# Patient Record
Sex: Male | Born: 1945
Health system: Southern US, Community
[De-identification: ages and names within clinical notes are randomized; demographics above are authoritative.]

## PROBLEM LIST (undated history)

## (undated) DIAGNOSIS — G20 Parkinson's disease: Secondary | ICD-10-CM

## (undated) DIAGNOSIS — F329 Major depressive disorder, single episode, unspecified: Secondary | ICD-10-CM

## (undated) DIAGNOSIS — F32A Depression, unspecified: Secondary | ICD-10-CM

## (undated) DIAGNOSIS — E119 Type 2 diabetes mellitus without complications: Secondary | ICD-10-CM

## (undated) DIAGNOSIS — E7800 Pure hypercholesterolemia, unspecified: Secondary | ICD-10-CM

## (undated) HISTORY — DX: Major depressive disorder, single episode, unspecified: F32.9

## (undated) HISTORY — DX: Depression, unspecified: F32.A

## (undated) HISTORY — DX: Parkinson's disease: G20

## (undated) HISTORY — PX: LEG SURGERY: SHX1003

---

## 2003-03-06 ENCOUNTER — Emergency Department (HOSPITAL_COMMUNITY): Admission: AD | Admit: 2003-03-06 | Discharge: 2003-03-06 | Payer: Self-pay | Admitting: Family Medicine

## 2008-01-25 ENCOUNTER — Emergency Department (HOSPITAL_COMMUNITY): Admission: EM | Admit: 2008-01-25 | Discharge: 2008-01-25 | Payer: Self-pay | Admitting: Emergency Medicine

## 2009-08-12 ENCOUNTER — Emergency Department (HOSPITAL_COMMUNITY): Admission: EM | Admit: 2009-08-12 | Discharge: 2009-08-12 | Payer: Self-pay | Admitting: Emergency Medicine

## 2009-09-04 ENCOUNTER — Emergency Department (HOSPITAL_COMMUNITY): Admission: EM | Admit: 2009-09-04 | Discharge: 2009-09-04 | Payer: Self-pay | Admitting: Emergency Medicine

## 2010-07-12 LAB — COMPREHENSIVE METABOLIC PANEL
ALT: 17 U/L (ref 0–53)
AST: 20 U/L (ref 0–37)
Albumin: 3.6 g/dL (ref 3.5–5.2)
Alkaline Phosphatase: 66 U/L (ref 39–117)
BUN: 14 mg/dL (ref 6–23)
CO2: 26 mEq/L (ref 19–32)
CO2: 25 mEq/L (ref 19–32)
Calcium: 9.2 mg/dL (ref 8.4–10.5)
Calcium: 9 mg/dL (ref 8.4–10.5)
Chloride: 105 mEq/L (ref 96–112)
Chloride: 104 mEq/L (ref 96–112)
Creatinine, Ser: 0.6 mg/dL (ref 0.4–1.5)
Creatinine, Ser: 0.57 mg/dL (ref 0.4–1.5)
GFR calc Af Amer: >60 mL/min (ref >60)
GFR calc non Af Amer: >60 mL/min (ref >60)
GFR calc non Af Amer: >60 mL/min (ref >60)
Glucose, Bld: 114 mg/dL — ABNORMAL HIGH (ref 70–99)
Glucose, Bld: 86 mg/dL (ref 70–99)
Potassium: 4.1 mEq/L (ref 3.5–5.1)
Sodium: 137 mEq/L (ref 135–145)
Total Bilirubin: 0.7 mg/dL (ref 0.3–1.2)
Total Bilirubin: 0.7 mg/dL (ref 0.3–1.2)
Total Protein: 7.4 g/dL (ref 6.0–8.3)

## 2010-07-12 LAB — CBC
HCT: 41.3 % (ref 39.0–52.0)
HCT: 40.6 % (ref 39.0–52.0)
Hemoglobin: 13.9 g/dL (ref 13.0–17.0)
Hemoglobin: 14.1 g/dL (ref 13.0–17.0)
MCHC: 33.7 g/dL (ref 30.0–36.0)
MCHC: 34.9 g/dL (ref 30.0–36.0)
MCV: 93.4 fL (ref 78.0–100.0)
MCV: 93.1 fL (ref 78.0–100.0)
Platelets: 268 10*3/uL (ref 150–400)
RBC: 4.43 MIL/uL (ref 4.22–5.81)
RBC: 4.36 MIL/uL (ref 4.22–5.81)
RDW: 14.2 % (ref 11.5–15.5)
WBC: 9.6 10*3/uL (ref 4.0–10.5)
WBC: 9.6 10*3/uL (ref 4.0–10.5)

## 2010-07-12 LAB — URINALYSIS, ROUTINE W REFLEX MICROSCOPIC
Bilirubin Urine: NEGATIVE
Bilirubin Urine: NEGATIVE
Glucose, UA: NEGATIVE mg/dL
Glucose, UA: NEGATIVE mg/dL
Hgb urine dipstick: NEGATIVE
Hgb urine dipstick: NEGATIVE
Ketones, ur: NEGATIVE mg/dL
Ketones, ur: NEGATIVE mg/dL
Nitrite: NEGATIVE
Nitrite: NEGATIVE
Protein, ur: NEGATIVE mg/dL
Protein, ur: NEGATIVE mg/dL
Specific Gravity, Urine: 1.016 (ref 1.005–1.030)
Specific Gravity, Urine: 1.014 (ref 1.005–1.030)
Urobilinogen, UA: 0.2 mg/dL (ref 0.0–1.0)
Urobilinogen, UA: 1 mg/dL (ref 0.0–1.0)
pH: 5.5 (ref 5.0–8.0)
pH: 7 (ref 5.0–8.0)

## 2010-07-12 LAB — DIFFERENTIAL
Basophils Absolute: 0 10*3/uL (ref 0.0–0.1)
Basophils Absolute: 0.1 10*3/uL (ref 0.0–0.1)
Basophils Relative: 0 % (ref 0–1)
Eosinophils Absolute: 0.2 10*3/uL (ref 0.0–0.7)
Eosinophils Absolute: 0.1 10*3/uL (ref 0.0–0.7)
Eosinophils Relative: 2 % (ref 0–5)
Eosinophils Relative: 1 % (ref 0–5)
Lymphocytes Relative: 25 % (ref 12–46)
Lymphocytes Relative: 25 % (ref 12–46)
Lymphs Abs: 2.4 10*3/uL (ref 0.7–4.0)
Lymphs Abs: 2.4 10*3/uL (ref 0.7–4.0)
Monocytes Absolute: 0.9 10*3/uL (ref 0.1–1.0)
Monocytes Relative: 9 % (ref 3–12)
Neutro Abs: 6.1 10*3/uL (ref 1.7–7.7)
Neutrophils Relative %: 64 % (ref 43–77)
Neutrophils Relative %: 65 % (ref 43–77)

## 2010-07-12 LAB — LIPASE, BLOOD: Lipase: 32 U/L (ref 11–59)

## 2010-07-12 LAB — URINE CULTURE

## 2010-07-12 LAB — PROTIME-INR
INR: 1.06 (ref 0.00–1.49)
Prothrombin Time: 13.7 seconds (ref 11.6–15.2)

## 2010-07-12 LAB — APTT: aPTT: 25 seconds (ref 24–37)

## 2010-07-12 LAB — GLUCOSE, CAPILLARY: Glucose-Capillary: 124 mg/dL — ABNORMAL HIGH (ref 70–99)

## 2010-07-12 LAB — LACTIC ACID, PLASMA: Lactic Acid, Venous: 1.2 mmol/L (ref 0.5–2.2)

## 2011-01-24 LAB — URINALYSIS, ROUTINE W REFLEX MICROSCOPIC
Glucose, UA: NEGATIVE
Hgb urine dipstick: NEGATIVE
Protein, ur: NEGATIVE
pH: 7.5

## 2011-01-24 LAB — COMPREHENSIVE METABOLIC PANEL
ALT: 21
AST: 17
Albumin: 4
Alkaline Phosphatase: 75
Calcium: 9.2
GFR calc Af Amer: >60
Glucose, Bld: 119 — ABNORMAL HIGH
Potassium: 3.9
Sodium: 138
Total Protein: 7.7

## 2011-01-24 LAB — URINE CULTURE
Colony Count: NO GROWTH
Culture: NO GROWTH

## 2011-01-24 LAB — POCT CARDIAC MARKERS
CKMB, poc: <1 — ABNORMAL LOW
Myoglobin, poc: 60.9
Troponin i, poc: <0.05

## 2011-01-24 LAB — D-DIMER, QUANTITATIVE: D-Dimer, Quant: 1.03 — ABNORMAL HIGH

## 2011-01-24 LAB — DIFFERENTIAL
Eosinophils Absolute: 2.7 — ABNORMAL HIGH
Lymphs Abs: 3.3
Monocytes Absolute: 1.3 — ABNORMAL HIGH
Monocytes Relative: 9
Neutrophils Relative %: 48

## 2011-01-24 LAB — CBC
MCHC: 33.1
Platelets: 343
RDW: 13.6

## 2011-01-24 LAB — GLUCOSE, CAPILLARY: Glucose-Capillary: 124 — ABNORMAL HIGH

## 2013-08-25 ENCOUNTER — Ambulatory Visit: Payer: Self-pay | Attending: Internal Medicine

## 2013-09-24 ENCOUNTER — Telehealth: Payer: Self-pay

## 2013-09-24 NOTE — Telephone Encounter (Signed)
Pt would like to set up Appointment to [facility].Pt will need an Interpeter

## 2013-10-08 ENCOUNTER — Telehealth: Payer: Self-pay | Admitting: Internal Medicine

## 2013-10-08 NOTE — Telephone Encounter (Signed)
Called patient to establish care. Patient unable to wait until mid July . Has scheduled elsewhere.

## 2013-10-21 ENCOUNTER — Encounter: Payer: Self-pay | Admitting: Internal Medicine

## 2013-10-21 ENCOUNTER — Ambulatory Visit: Payer: Self-pay | Attending: Internal Medicine | Admitting: Internal Medicine

## 2013-10-21 VITALS — BP 143/73 | HR 63 | Temp 98.4°F | Resp 14 | Ht 66.5 in | Wt 155.0 lb

## 2013-10-21 DIAGNOSIS — F32A Depression, unspecified: Secondary | ICD-10-CM | POA: Insufficient documentation

## 2013-10-21 DIAGNOSIS — M546 Pain in thoracic spine: Secondary | ICD-10-CM | POA: Insufficient documentation

## 2013-10-21 DIAGNOSIS — Z87891 Personal history of nicotine dependence: Secondary | ICD-10-CM | POA: Insufficient documentation

## 2013-10-21 DIAGNOSIS — G20A1 Parkinson's disease without dyskinesia, without mention of fluctuations: Secondary | ICD-10-CM | POA: Insufficient documentation

## 2013-10-21 DIAGNOSIS — F3289 Other specified depressive episodes: Secondary | ICD-10-CM

## 2013-10-21 DIAGNOSIS — Z0000 Encounter for general adult medical examination without abnormal findings: Secondary | ICD-10-CM | POA: Insufficient documentation

## 2013-10-21 DIAGNOSIS — F329 Major depressive disorder, single episode, unspecified: Secondary | ICD-10-CM | POA: Insufficient documentation

## 2013-10-21 DIAGNOSIS — G20 Parkinson's disease: Secondary | ICD-10-CM

## 2013-10-21 LAB — COMPLETE METABOLIC PANEL WITH GFR
ALBUMIN: 4 g/dL (ref 3.5–5.2)
ALT: <8 U/L (ref 0–53)
AST: 12 U/L (ref 0–37)
Alkaline Phosphatase: 69 U/L (ref 39–117)
BUN: 11 mg/dL (ref 6–23)
CALCIUM: 9.1 mg/dL (ref 8.4–10.5)
CHLORIDE: 103 meq/L (ref 96–112)
CO2: 29 meq/L (ref 19–32)
Creat: 0.66 mg/dL (ref 0.50–1.35)
GLUCOSE: 101 mg/dL — AB (ref 70–99)
POTASSIUM: 4.5 meq/L (ref 3.5–5.3)
SODIUM: 136 meq/L (ref 135–145)
TOTAL PROTEIN: 7.1 g/dL (ref 6.0–8.3)
Total Bilirubin: 0.4 mg/dL (ref 0.2–1.2)

## 2013-10-21 LAB — CBC WITH DIFFERENTIAL/PLATELET
BASOS PCT: 0 % (ref 0–1)
Basophils Absolute: 0 10*3/uL (ref 0.0–0.1)
Eosinophils Absolute: 0.1 10*3/uL (ref 0.0–0.7)
Eosinophils Relative: 1 % (ref 0–5)
HEMATOCRIT: 38.4 % — AB (ref 39.0–52.0)
HEMOGLOBIN: 13.4 g/dL (ref 13.0–17.0)
LYMPHS ABS: 2.5 10*3/uL (ref 0.7–4.0)
Lymphocytes Relative: 27 % (ref 12–46)
MCH: 30.7 pg (ref 26.0–34.0)
MCHC: 34.9 g/dL (ref 30.0–36.0)
MCV: 88.1 fL (ref 78.0–100.0)
MONO ABS: 0.8 10*3/uL (ref 0.1–1.0)
MONOS PCT: 9 % (ref 3–12)
NEUTROS ABS: 5.9 10*3/uL (ref 1.7–7.7)
Neutrophils Relative %: 63 % (ref 43–77)
Platelets: 332 10*3/uL (ref 150–400)
RBC: 4.36 MIL/uL (ref 4.22–5.81)
RDW: 14 % (ref 11.5–15.5)
WBC: 9.4 10*3/uL (ref 4.0–10.5)

## 2013-10-21 LAB — LIPID PANEL
Cholesterol: 172 mg/dL (ref 0–200)
HDL: 46 mg/dL (ref >39)
LDL CALC: 117 mg/dL — AB (ref 0–99)
Total CHOL/HDL Ratio: 3.7 Ratio
Triglycerides: 44 mg/dL (ref <150)
VLDL: 9 mg/dL (ref 0–40)

## 2013-10-21 LAB — POCT GLYCOSYLATED HEMOGLOBIN (HGB A1C): HEMOGLOBIN A1C: 5.6

## 2013-10-21 LAB — TSH: TSH: 0.554 u[IU]/mL (ref 0.350–4.500)

## 2013-10-21 MED ORDER — VITAMIN B-1 100 MG PO TABS: 100.0000 mg | ORAL_TABLET | Freq: Every day | ORAL | Status: DC

## 2013-10-21 MED ORDER — CARBIDOPA-LEVODOPA 25-250 MG PO TABS: 1.0000 | ORAL_TABLET | Freq: Every day | ORAL | Status: DC

## 2013-10-21 MED ORDER — PAROXETINE HCL 20 MG PO TABS: 20.0000 mg | ORAL_TABLET | Freq: Every day | ORAL | Status: DC

## 2013-10-21 MED ORDER — THERA VITAL M PO TABS: 1.0000 | ORAL_TABLET | Freq: Every day | ORAL | Status: DC

## 2013-10-21 NOTE — Progress Notes (Signed)
Pt is here to establish care. Pt has a history of parkinson's and depression. Pt reports having pain in his lower back and in one testicle. Pt states that he may have a cold w/ runny nose, coughing and fever. Pt states that he has seen blood in his mucus.  Pt has an interpreter.

## 2013-10-21 NOTE — Progress Notes (Signed)
Patient ID: Brandon Carrillo, male   DOB: 06/19/1945, 68 y.o.   MRN: [id number]   Brandon Carrillo, is a 68 y.o. male  [id number]  [id number]  DOB - 01/19/1946  CC:  Chief Complaint  Patient presents with  . Establish Care       HPI: Brandon Carrillo is a 68 y.o. male here today to establish medical care. Patient was brought in to the clinic by a church member and a neighbor because he lack care and was known to have Parkinson's disease and major depression. Patient has no family member, lives at home alone. Patient is tearful during this visit because he feels embarrassed that someone other than his family is caring for him. He takes carbidopa levodopa 20-250 mg tablet for Parkinson's disease but not on any medication for depression. He denies suicidal ideation or attempt. She has no personal history of hypertension or diabetes. According to the church member patient [place] in [place] but he has no contact with them. As long as patient takes his medications he feels fine, with less tremor. Patient has No headache, No chest pain, No abdominal pain - No Nausea, No new weakness tingling or numbness, No Cough - SOB.  No Known Allergies Past Medical History  Diagnosis Date  . Depression   . Parkinson's disease    No current outpatient prescriptions on file prior to visit.   No current facility-administered medications on file prior to visit.   Family History  Problem Relation Age of Onset  . Diabetes Mother    History   Social History  . Marital Status: Married    Spouse Name: N/A    Number of Children: N/A  . Years of Education: N/A   Occupational History  . Not on file.   Social History Main Topics  . Smoking status: Former Games developermoker  . Smokeless tobacco: Not on file  . Alcohol Use: No  . Drug Use: No  . Sexual Activity: Yes   Other Topics Concern  . Not on file   Social History Narrative  . No narrative on file    Review of  Systems: Constitutional: Negative for fever, chills, diaphoresis, activity change, appetite change and fatigue. HENT: Negative for ear pain, nosebleeds, congestion, facial swelling, rhinorrhea, neck pain, neck stiffness and ear discharge.  Eyes: Negative for pain, discharge, redness, itching and visual disturbance. Respiratory: Negative for cough, choking, chest tightness, shortness of breath, wheezing and stridor.  Cardiovascular: Negative for chest pain, palpitations and leg swelling. Gastrointestinal: Negative for abdominal distention. Genitourinary: Negative for dysuria, urgency, frequency, hematuria, flank pain, decreased urine volume, difficulty urinating and dyspareunia.  Musculoskeletal: Negative for back pain, joint swelling, arthralgia and gait problem. Neurological: Negative for dizziness, ++tremors,  Hematological: Negative for adenopathy. Does not bruise/bleed easily. Psychiatric/Behavioral: Negative for hallucinations, behavioral problems, decreased concentration and agitation.    Objective:   Filed Vitals:   10/21/13 1405  BP: 143/73  Pulse: 63  Temp: 98.4 F (36.9 C)  Resp: 14    Physical Exam: Constitutional: Patient appears unkempt but No distress. HENT: Normocephalic, atraumatic, External right and left ear normal. Oropharynx is clear and moist.  Eyes: Conjunctivae and EOM are normal. PERRLA, no scleral icterus. Neck: Normal ROM. Neck supple. No JVD. No tracheal deviation. No thyromegaly. CVS: RRR, S1/S2 +, no murmurs, no gallops, no carotid bruit.  Pulmonary: Effort and breath sounds normal, no stridor, rhonchi, wheezes, rales.  Abdominal: Soft. BS +, no distension, tenderness, rebound or guarding.  Musculoskeletal: Normal  range of motion. No edema and no tenderness.  Lymphadenopathy: No lymphadenopathy noted, cervical, inguinal or axillary Neuro: Shuffling gait, mild hand tremor Skin: Skin is warm and dry. No rash noted. Not diaphoretic. No erythema. No  pallor. Psychiatric: Depressed mood and flat affect. Tearful  Lab Results  Component Value Date   WBC 9.6 09/04/2009   HGB 13.9 09/04/2009   HCT 41.3 09/04/2009   MCV 93.4 09/04/2009   PLT 268 09/04/2009   Lab Results  Component Value Date   CREATININE 0.60 09/04/2009   BUN 14 09/04/2009   NA 137 09/04/2009   K 4.1 09/04/2009   CL 105 09/04/2009   CO2 26 09/04/2009    Lab Results  Component Value Date   HGBA1C 5.6 10/21/2013    Lipid Panel  No results found for this basename: chol, trig, hdl, cholhdl, vldl, ldlcalc       Assessment and plan:   1. Parkinson's disease  - CBC with Differential - COMPLETE METABOLIC PANEL WITH GFR - POCT glycosylated hemoglobin (Hb A1C) - Lipid panel - TSH - Urinalysis, Complete - Ambulatory referral to Neurology  2. Depression  - PARoxetine (PAXIL) 20 MG tablet; Take 1 tablet (20 mg total) by mouth daily.  Dispense: 30 tablet; Refill: 3 Ambulatory referral to psychiatrist and neurologist   Patient was extensively counseled about nutrition and exercise  Return in about 3 months (around 01/21/2014), or if symptoms worsen or fail to improve, for Follow up Pain and comorbidities, Follow up HTN.  The patient was given clear instructions to go to ER or return to medical center if symptoms don't improve, worsen or new problems develop. The patient verbalized understanding. The patient was told to call to get lab results if they haven't heard anything in the next week.     This note has been created with Education officer, environmentalDragon speech recognition software and smart phrase technology. Any transcriptional errors are unintentional.    Jeanann LewandowskyJEGEDE, OLUGBEMIGA, MD, MHA, FACP, FAAP [facility] [place], [place] [phone]   10/21/2013, 3:24 PM

## 2013-10-21 NOTE — Patient Instructions (Signed)
Enfermedad de Parkinson  (Parkinson Disease) La enfermedad de Parkinson es un trastorno del sistema nervioso central, que afecta el cerebro y la mdula espinal. La persona que sufre esta enfermedad pierde la capacidad de controlar completamente los movimientos del cuerpo. Dentro del cerebro, hay un grupo de clulas nerviosas (ganglios basales) que ayudan al control de los movimientos. Los ganglios basales estn daados y no funcionan adecuadamente en una persona con enfermedad de Parkinson. Adems, los ganglios basales producen y [place] People's Democratic Republicutilizan una sustancia qumica llamada dopamina. La domamina enva mensajes a otras partes del cuerpo para controlar y coordinar los movimientos corporales. En una persona que sufre la enfermedad de Parkinson los niveles de dopamina son bajos. Si los niveles de dopamina son bajos, el organismo no recibe los mensajes que necesita para moverse normalmente.  CAUSAS  La causa exacta del dao a los ganglios basales no se conoce. Algunas investigaciones mdicas consideran que las infecciones, los genes, el medio ambiente y ciertos medicamentos pueden contribuir a Medical laboratory scientific officerla causa.  SNTOMAS   Uno de los primeros sntomas de esta enfermedad son las sacudidas incontrolables (temblor) en las manos. Generalmente el temblor desaparece cuando la mano afectada se utiliza concientemente.  El caminar, Heritage managerhablar, levantarse de la silla y los movimientos nuevos se hacen cada vez ms difciles a medida que la enfermedad avanza.  Los msculos se vuelven rgidos y los movimientos se hacen ms lentos.  El equilibrio y la coordinacin se hacen ms difciles.  Puede haber depresin, dificultad para tragar, problemas urinarios, constipacin y problemas para dormir.  En etapas posteriores de la enfermedad, pueden deteriorarse los procesos de pensamiento y Scientist, clinical (histocompatibility and immunogenetics)la memoria. DIAGNSTICO  No hay ninguna prueba especfica que pueda diagnosticar la enfermedad de Parkinson. Podr ser derivado a un neurlogo para  Development worker, communityrealizar una evaluacin. El mdico revisar su historia [place]ica, los sntomas y le har un examen fsico. Conley RollsLe indicarn anlisis de sangre y pruebas de diagnstico por imgenes del cerebro para descartar otras enfermedades. El diagnstico por imgenes podr incluir una resonancia magntica o una tomografa computada.  TRATAMIENTO  El objetivo del tratamiento es aliviar los sntomas. Podrn recetarle medicamentos una vez que los sntomas se vuelvan un problema. Los medicamentos no detendrn el avance de la enfermedad, pero pueden AES Corporationmejorar los movimientos y el equilibrio y Contractorayudar a Sara Leedisminuir los temblores. Tambin se indica biorretroalimentacin y psicoterapia. En personas jvenes se podr optar por un tratamiento quirrgico del cerebro.  INSTRUCCIONES PARA EL CUIDADO EN EL HOGAR   Practique actividad fsica de [place] regular y tome perodos de descanso durante el da para ayudar a prevenir el cansancio y la depresin.  Si le resulta difcil vestirse, reemplace los botones y los cierres con OncologistVelcro y elstico en la ropa.  Tome todos los medicamentos segn le indic su mdico.  Instale barras o rieles en su casa para evitar cadas.  Concurra a una terapia del habla segn las indicaciones.  Cumpla con todas las visitas de control, segn le indique su mdico. SOLICITE ATENCIN MDICA SI:   Los sntomas no se alivian con los United Parcelmedicamentos.  Se cae.  Tiene dificultad para tragar o se ahoga con los medicamentos. ASEGRESE DE QUE:   Comprende estas instrucciones.  Controlar su enfermedad.  Solicitar ayuda de inmediato si no mejora o empeora. Document Released: 01/18/2005 Document Revised: 08/05/2012 [facility] Patient Information 2015 [employer], [place]. This information is not intended to replace advice given to you by your health care provider. Make sure you discuss any questions you have with your health care provider.

## 2013-10-22 LAB — URINALYSIS, COMPLETE
BACTERIA UA: NONE SEEN
Bilirubin Urine: NEGATIVE
CASTS: NONE SEEN
CRYSTALS: NONE SEEN
Glucose, UA: NEGATIVE mg/dL
Hgb urine dipstick: NEGATIVE
Ketones, ur: NEGATIVE mg/dL
LEUKOCYTES UA: NEGATIVE
NITRITE: NEGATIVE
PH: 6 (ref 5.0–8.0)
Protein, ur: NEGATIVE mg/dL
SPECIFIC GRAVITY, URINE: 1.009 (ref 1.005–1.030)
Squamous Epithelial / LPF: NONE SEEN
UROBILINOGEN UA: 0.2 mg/dL (ref 0.0–1.0)

## 2013-10-27 ENCOUNTER — Telehealth: Payer: Self-pay

## 2013-10-27 NOTE — Telephone Encounter (Signed)
Interpreter line used Patient not available Unable to leave message-no voice mail  

## 2013-10-27 NOTE — Telephone Encounter (Signed)
Message copied by Lestine MountJUAREZ, Suhaylah Wampole L on Mon Oct 27, 2013  1:55 PM ------      Message from: Jeanann LewandowskyJEGEDE, OLUGBEMIGA E      Created: Wed Oct 22, 2013 10:45 PM       Please inform patient that his laboratory tests results are mostly within normal limits. We encouraged low carbohydrate and low cholesterol diet. ------

## 2013-11-04 ENCOUNTER — Ambulatory Visit (INDEPENDENT_AMBULATORY_CARE_PROVIDER_SITE_OTHER): Payer: Self-pay | Admitting: Neurology

## 2013-11-04 ENCOUNTER — Encounter: Payer: Self-pay | Admitting: Neurology

## 2013-11-04 VITALS — BP 158/78 | HR 68 | Resp 16 | Ht 67.0 in | Wt 157.0 lb

## 2013-11-04 DIAGNOSIS — F329 Major depressive disorder, single episode, unspecified: Secondary | ICD-10-CM

## 2013-11-04 DIAGNOSIS — G20 Parkinson's disease: Secondary | ICD-10-CM

## 2013-11-04 DIAGNOSIS — G20A1 Parkinson's disease without dyskinesia, without mention of fluctuations: Secondary | ICD-10-CM

## 2013-11-04 DIAGNOSIS — F32A Depression, unspecified: Secondary | ICD-10-CM

## 2013-11-04 DIAGNOSIS — F3289 Other specified depressive episodes: Secondary | ICD-10-CM

## 2013-11-04 MED ORDER — CARBIDOPA-LEVODOPA ER 50-200 MG PO TBCR: 1.0000 | EXTENDED_RELEASE_TABLET | Freq: Every day | ORAL | Status: DC

## 2013-11-04 NOTE — Patient Instructions (Addendum)
1. You have been referred to [facility]hab. They will call you directly to schedule an appointment.  Please call [phone] if you do not hear from them.  2. Take your medication as follows: Carbidopa Levodopa 25/250 take 2 tablets at 6 am, 2 tablets at 11 am and 2 tablets at 3 pm. Add 1 tablet of Carbidopa Levodopa 50/200 at 8 pm. We will send this to your pharmacy.  3. Follow up in 8-10 weeks.   1. Usted ha sido [facility] . Ellos llamar directamente para programar una cita . Por favor llame a [phone] si usted no tiene noticias de ellos. 2. Tome sus medicamentos de la siguiente manera : La carbidopa Levodopa 25/250 tomar 2 comprimidos a las 6 am , 2 tabletas a las 11 am y 2 tabletas a 15:00 . Aadir 1 comprimido de Carbidopa Levodopa 50/200 a las 8 pm . Vamos a enviar esto a su farmacia. 3. Siga en 8-10 semanas .

## 2013-11-04 NOTE — Progress Notes (Signed)
Brandon Carrillo was seen today in the movement disorders clinic for neurologic consultation at the request of Dr. Hyman Hopes.  The consultation is for the evaluation of PD.  The patient is accompanied by a friend, who supplements the history.  His friend also helps him at home and with bringing him to appoitments.  There is also a Orthoptist present.  I reviewed the limited medical records that are available.  The first symptom(s) the patient noticed was and that was 8 years ago.   First sx was tremor of the R hand/arm.  Still has no tremor elsewhere.  It was diagnosed in [place] but has never seen a neurologist.  He is on carbidopa/levodopa 25/250, 2 at 6 am, 2 at 2 pm and 2 at 10 pm now but started on one tablet tid originally.  He actually goes to bed at 8 pm but will awaken at 10 pm just to take the 10 pm dose of medication.  He notes wearing off of the medication at least 1 hour before the dosage.  It takes about 1 hour for the first morning pill to work.    Specific Symptoms:  Tremor: Yes.   Voice: hypophonic Sleep: some trouble staying asleep  Vivid Dreams:  Yes.    Acting out dreams:  Yes.   (kicks out but doesn't get out of the bed) Wet Pillows: Yes.   Postural symptoms:  Yes.    Falls?  Yes.  , 2 falls over 2 years, the last 1 month ago going up stairs and tripped over feet; never had fx with fall Bradykinesia symptoms: shuffling gait and difficulty getting out of a chair Loss of smell:  No. Loss of taste:  No. Urinary Incontinence:  Yes.   (morning time and sometimes late night; doesn't wear undergarment) Difficulty Swallowing:  Yes.   but describes more sore throat but some pill dysphagia Handwriting, micrographia: No. Trouble with ADL's:  Yes.    Trouble buttoning clothing: Yes.   Depression:  Yes.   he was just started on Paxil, 20 mg on 10/21/2013. Memory changes:  Yes.   (son takes care of his finances but that is for financial reasons; rarely forgets to take  medications; pt doesn't drive - states that he quit driving because of PD 2 years ago) Hallucinations:  No.  visual distortions: No. N/V:  Yes.   (nausea rarely) Lightheaded:  Yes.    Syncope: No. Diplopia:  Yes.   (rare) Dyskinesia:  No.   PREVIOUS MEDICATIONS: Sinemet (no other PD meds)  ALLERGIES:  No Known Allergies  CURRENT MEDICATIONS:  Current Outpatient Prescriptions on File Prior to Visit  Medication Sig Dispense Refill  . carbidopa-levodopa (SINEMET IR) 25-250 MG per tablet Take 1 tablet by mouth 5 (five) times daily. Take 2 tabs in the morning, 1 in the afternoon and 2 at night.  150 tablet  6  . Multiple Vitamins-Minerals (MULTIVITAMIN) tablet Take 1 tablet by mouth daily.  90 tablet  3  . PARoxetine (PAXIL) 20 MG tablet Take 1 tablet (20 mg total) by mouth daily.  30 tablet  3  . thiamine (VITAMIN B-1) 100 MG tablet Take 1 tablet (100 mg total) by mouth daily.  30 tablet  3   No current facility-administered medications on file prior to visit.    PAST MEDICAL HISTORY:   Past Medical History  Diagnosis Date  . Depression   . Parkinson's disease     PAST SURGICAL HISTORY:   Past  Surgical History  Procedure Laterality Date  . Leg surgery      SOCIAL HISTORY:   History   Social History  . Marital Status: Married    Spouse Name: N/A    Number of Children: N/A  . Years of Education: N/A   Occupational History  . Not on file.   Social History Main Topics  . Smoking status: Former Games developermoker  . Smokeless tobacco: Not on file     Comment: as a teenager  . Alcohol Use: No  . Drug Use: No  . Sexual Activity: Yes   Other Topics Concern  . Not on file   Social History Narrative  . No narrative on file    FAMILY HISTORY:   Family Status  Relation Status Death Age  . Mother Deceased     diabetes  . Father Deceased     "old age"  . Brother Alive     healthy  . Brother Alive     healthy  . Son Alive     healthy  . Son Alive     healthy  . Son  Alive     healthy  . Son Alive     healthy  . Daughter Alive     healthy  . Daughter Alive     healthy    ROS:  A complete 10 system review of systems was obtained and was unremarkable apart from what is mentioned above.  PHYSICAL EXAMINATION:    VITALS:   Filed Vitals:   11/04/13 0842  BP: 158/78  Pulse: 68  Resp: 16  Height: 5\' 7"  (1.702 m)  Weight: 157 lb (71.215 kg)    GEN:  The patient appears stated age and is in NAD.  Rarely teary when talks about relationship with family and how his friend present helps him more than his family  HEENT:  Normocephalic, atraumatic.  The mucous membranes are moist. The superficial temporal arteries are without ropiness or tenderness. CV:  RRR Lungs:  CTAB Neck/HEME:  There are no carotid bruits bilaterally.  Neurological examination:  Orientation: The patient is alert and oriented x3. Fund of knowledge is appropriate.  Recent and remote memory are intact.  Attention and concentration are normal.    Able to name objects and repeat phrases. Cranial nerves: There is good facial symmetry.  There is significant facial hypomimia.  Pupils are equal round and reactive to light bilaterally. Fundoscopic exam is attempted but the disc margins are not well visualized bilaterally.   Extraocular muscles are intact. The visual fields are full to confrontational testing.  The patient speaks Spanish and this examiner does not, so I cannot assess whether the speech is fluent/clear, but it does appear to be and the translator reports it as so.  It is slightly hypophonic. Soft palate rises symmetrically and there is no tongue deviation. Hearing is intact to conversational tone. Sensation: Sensation is intact to light and pinprick throughout (facial, trunk, extremities). Vibration is intact at the bilateral big toe. There is no extinction with double simultaneous stimulation. There is no sensory dermatomal level identified. Motor: Strength is 5/5 in the bilateral  upper and lower extremities.   Shoulder shrug is equal and symmetric.  There is no pronator drift. Deep tendon reflexes: Deep tendon reflexes are 2-/4 at the bilateral biceps, triceps, brachioradialis, patella and 1/4 at the bilateral achilles. Plantar responses are downgoing bilaterally.  Movement examination: Tone: There is mild increased tone in the bilateral upper extremities.  The tone  in the lower extremities is normal.  Abnormal movements: There is an independent bilateral upper extremity resting tremor. Coordination:  There is definite decremation with RAM's, seen with virtually all forms of rapid alternating movements, the left was somewhat worse than the right. Gait and Station: The patient has no significant difficulty arising out of a deep-seated chair without the use of the hands. The patient's stride length is decreased and he has a festinating gait.    ASSESSMENT/PLAN:  1.  Idiopathic Parkinson's disease.    -We discussed the diagnosis as well as pathophysiology of the disease.  We discussed treatment options as well as prognostic indicators.  Patient education was provided.  -Greater than 50% of the 80 minute visit was spent in counseling answering questions and talking about what to expect now as well as in the future.  We talked about medication options as well as potential future surgical options.  We talked about safety in the home.  -We decided to move his current carbidopa/levodopa closer together.  He is currently on 25/250 of the carbidopa/levodopa and he will take 2 tablets at 6 AM, 2 tablets at 11 AM, 2 tablets at 3 PM and we will add a carbidopa/levodopa 50/200 CR at bedtime (8 PM).  Risks, benefits, side effects and alternative therapies were discussed.  The opportunity to ask questions was given and they were answered to the best of my ability.  The patient expressed understanding and willingness to follow the outlined treatment protocols.  -I will refer the patient to the  Parkinson's program at the [facility], for PT/OT.  We talked about the importance of cardiovascular exercise in Parkinson's disease. 2.  Return in about 9 weeks (around 01/06/2014) for end of day appt.

## 2013-11-18 ENCOUNTER — Ambulatory Visit: Payer: Self-pay | Attending: Neurology | Admitting: Physical Therapy

## 2013-11-18 DIAGNOSIS — IMO0001 Reserved for inherently not codable concepts without codable children: Secondary | ICD-10-CM | POA: Insufficient documentation

## 2013-11-18 DIAGNOSIS — G20 Parkinson's disease: Secondary | ICD-10-CM | POA: Insufficient documentation

## 2013-11-18 DIAGNOSIS — R269 Unspecified abnormalities of gait and mobility: Secondary | ICD-10-CM | POA: Insufficient documentation

## 2013-11-18 DIAGNOSIS — G20A1 Parkinson's disease without dyskinesia, without mention of fluctuations: Secondary | ICD-10-CM | POA: Insufficient documentation

## 2013-11-20 ENCOUNTER — Ambulatory Visit: Payer: Self-pay | Admitting: Physical Therapy

## 2013-11-27 ENCOUNTER — Ambulatory Visit: Payer: Self-pay | Attending: Neurology

## 2013-11-27 DIAGNOSIS — G20A1 Parkinson's disease without dyskinesia, without mention of fluctuations: Secondary | ICD-10-CM | POA: Insufficient documentation

## 2013-11-27 DIAGNOSIS — G20 Parkinson's disease: Secondary | ICD-10-CM | POA: Insufficient documentation

## 2013-11-27 DIAGNOSIS — R269 Unspecified abnormalities of gait and mobility: Secondary | ICD-10-CM | POA: Insufficient documentation

## 2013-11-27 DIAGNOSIS — IMO0001 Reserved for inherently not codable concepts without codable children: Secondary | ICD-10-CM | POA: Insufficient documentation

## 2013-11-28 ENCOUNTER — Ambulatory Visit: Payer: Self-pay

## 2013-12-02 ENCOUNTER — Ambulatory Visit: Payer: Self-pay | Admitting: Physical Therapy

## 2013-12-05 ENCOUNTER — Ambulatory Visit: Payer: Self-pay | Admitting: Occupational Therapy

## 2013-12-05 ENCOUNTER — Ambulatory Visit: Payer: Self-pay | Admitting: Physical Therapy

## 2013-12-08 ENCOUNTER — Ambulatory Visit: Payer: Self-pay | Admitting: Physical Therapy

## 2013-12-08 ENCOUNTER — Ambulatory Visit: Payer: Self-pay | Admitting: Occupational Therapy

## 2013-12-10 ENCOUNTER — Ambulatory Visit: Payer: Self-pay | Admitting: Physical Therapy

## 2013-12-16 ENCOUNTER — Ambulatory Visit: Payer: Self-pay | Admitting: Physical Therapy

## 2013-12-16 ENCOUNTER — Ambulatory Visit: Payer: Self-pay | Admitting: Occupational Therapy

## 2013-12-18 ENCOUNTER — Encounter: Payer: Self-pay | Admitting: Occupational Therapy

## 2013-12-18 ENCOUNTER — Ambulatory Visit: Payer: Self-pay | Admitting: Physical Therapy

## 2013-12-19 ENCOUNTER — Ambulatory Visit: Payer: Self-pay | Admitting: Occupational Therapy

## 2013-12-19 ENCOUNTER — Ambulatory Visit: Payer: Self-pay | Admitting: Physical Therapy

## 2013-12-23 ENCOUNTER — Ambulatory Visit: Payer: Self-pay | Admitting: Physical Therapy

## 2013-12-23 ENCOUNTER — Ambulatory Visit: Payer: Self-pay | Attending: Neurology | Admitting: Occupational Therapy

## 2013-12-23 DIAGNOSIS — G20A1 Parkinson's disease without dyskinesia, without mention of fluctuations: Secondary | ICD-10-CM | POA: Insufficient documentation

## 2013-12-23 DIAGNOSIS — IMO0001 Reserved for inherently not codable concepts without codable children: Secondary | ICD-10-CM | POA: Insufficient documentation

## 2013-12-23 DIAGNOSIS — R269 Unspecified abnormalities of gait and mobility: Secondary | ICD-10-CM | POA: Insufficient documentation

## 2013-12-23 DIAGNOSIS — G20 Parkinson's disease: Secondary | ICD-10-CM | POA: Insufficient documentation

## 2013-12-24 ENCOUNTER — Ambulatory Visit: Payer: Self-pay | Admitting: Physical Therapy

## 2013-12-24 ENCOUNTER — Ambulatory Visit: Payer: Self-pay | Admitting: Occupational Therapy

## 2013-12-30 ENCOUNTER — Ambulatory Visit: Payer: Self-pay | Admitting: Physical Therapy

## 2013-12-30 ENCOUNTER — Ambulatory Visit: Payer: Self-pay | Admitting: Occupational Therapy

## 2014-01-01 ENCOUNTER — Ambulatory Visit: Payer: Self-pay | Admitting: Physical Therapy

## 2014-01-01 ENCOUNTER — Ambulatory Visit: Payer: Self-pay | Admitting: Occupational Therapy

## 2014-01-05 ENCOUNTER — Ambulatory Visit: Payer: Self-pay | Admitting: Physical Therapy

## 2014-01-05 ENCOUNTER — Ambulatory Visit: Payer: Self-pay | Admitting: Occupational Therapy

## 2014-01-06 ENCOUNTER — Ambulatory Visit (INDEPENDENT_AMBULATORY_CARE_PROVIDER_SITE_OTHER): Payer: Self-pay | Admitting: Neurology

## 2014-01-06 ENCOUNTER — Encounter: Payer: Self-pay | Admitting: Neurology

## 2014-01-06 VITALS — BP 136/60 | HR 64 | Ht 66.0 in | Wt 161.0 lb

## 2014-01-06 DIAGNOSIS — G20 Parkinson's disease: Secondary | ICD-10-CM

## 2014-01-06 MED ORDER — CARBIDOPA-LEVODOPA 25-250 MG PO TABS: 2.0000 | ORAL_TABLET | Freq: Three times a day (TID) | ORAL | Status: DC

## 2014-01-06 NOTE — Progress Notes (Signed)
Brandon Carrillo was seen today in the movement disorders clinic for neurologic consultation at the request of Dr. Hyman Hopes.  The consultation is for the evaluation of PD.  The patient is accompanied by a friend, who supplements the history.  His friend also helps him at home and with bringing him to appoitments.  There is also a Orthoptist present.  I reviewed the limited medical records that are available.  The first symptom(s) the patient noticed was and that was 8 years ago.   First sx was tremor of the R hand/arm.  Still has no tremor elsewhere.  It was diagnosed in [place] but has never seen a neurologist.  He is on carbidopa/levodopa 25/250, 2 at 6 am, 2 at 2 pm and 2 at 10 pm now but started on one tablet tid originally.  He actually goes to bed at 8 pm but will awaken at 10 pm just to take the 10 pm dose of medication.  He notes wearing off of the medication at least 1 hour before the dosage.  It takes about 1 hour for the first morning pill to work.    01/06/14 update:  Patient is following up today.  A medical translator is present as is his friend, who supplements the history.  He is currently on carbidopa/levodopa 25/250, 2 tablets at 6 AM/11 AM/3 PM and carbidopa/levodopa 50/200 was added at 8 PM.  We referred him [facility]'s [facility].  He states that he is markedly better.  He has some tremor but stiffness is much better.  No falls.  Medication doesn't wear off until it is time for the next dosage.  Exercising some at home.  Off of antidepressants but mood is much better.  No hallucinations.  No near sycope.    PREVIOUS MEDICATIONS: Sinemet (no other PD meds)  ALLERGIES:  No Known Allergies  CURRENT MEDICATIONS:  Current Outpatient Prescriptions on File Prior to Visit  Medication Sig Dispense Refill  . carbidopa-levodopa (SINEMET CR) 50-200 MG per tablet Take 1 tablet by mouth at bedtime.  30 tablet  5  . Multiple  Vitamins-Minerals (MULTIVITAMIN) tablet Take 1 tablet by mouth daily.  90 tablet  3  . thiamine (VITAMIN B-1) 100 MG tablet Take 1 tablet (100 mg total) by mouth daily.  30 tablet  3   No current facility-administered medications on file prior to visit.    PAST MEDICAL HISTORY:   Past Medical History  Diagnosis Date  . Depression   . Parkinson's disease     PAST SURGICAL HISTORY:   Past Surgical History  Procedure Laterality Date  . Leg surgery      SOCIAL HISTORY:   History   Social History  . Marital Status: Widowed    Spouse Name: N/A    Number of Children: N/A  . Years of Education: N/A   Occupational History  . Not on file.   Social History Main Topics  . Smoking status: Former Games developer  . Smokeless tobacco: Not on file     Comment: as a teenager  . Alcohol Use: No  . Drug Use: No  . Sexual Activity: Yes   Other Topics Concern  . Not on file   Social History Narrative  . No narrative on file    FAMILY HISTORY:   Family Status  Relation Status Death Age  . Mother Deceased     diabetes  . Father Deceased     "old  age"  . Brother Alive     healthy  . Brother Alive     healthy  . Son Alive     healthy  . Son Alive     healthy  . Son Alive     healthy  . Son Alive     healthy  . Daughter Alive     healthy  . Daughter Alive     healthy    ROS:  A complete 10 system review of systems was obtained and was unremarkable apart from what is mentioned above.  PHYSICAL EXAMINATION:    VITALS:   Filed Vitals:   01/06/14 1510  BP: 136/60  Pulse: 64  Height:  (1.676 m)  Weight: 161 lb (73.029 kg)    GEN:  The patient appears stated age and is in NAD.  Smiles and laughs today.  Makes jokes. HEENT:  Normocephalic, atraumatic.  The mucous membranes are moist. The superficial temporal arteries are without ropiness or tenderness. CV:  RRR Lungs:  CTAB Neck/HEME:  There are no carotid bruits bilaterally.  Neurological  examination:  Orientation: The patient is alert and oriented x3. Cranial nerves: There is good facial symmetry.  There is facial hypomimia.  Pupils are equal round and reactive to light bilaterally. Fundoscopic exam is attempted but the disc margins are not well visualized bilaterally.   Extraocular muscles are intact. The visual fields are full to confrontational testing.  The patient speaks Spanish and this examiner does not, so I cannot assess whether the speech is fluent/clear, but it does appear to be and the translator reports it as so.  It is slightly hypophonic. Soft palate rises symmetrically and there is no tongue deviation. Hearing is intact to conversational tone. Sensation: Sensation is intact to light touch throughout Motor: Strength is at least antigravity x 4.   Movement examination: Tone: There is normal tone in the bilateral upper extremities.  The tone in the lower extremities is normal.  Abnormal movements: There is a rare tremor in the LUE when distracted Coordination:  There is minimal decremation today with finger taps bilaterally but otherwise hand opening/closing, alternating supination/pronation, heel taps and toe taps were normal Gait and Station: The patient has no significant difficulty arising out of a deep-seated chair without the use of the hands. The patient's stride length is greatly improved with no festination.    ASSESSMENT/PLAN:  1.  Idiopathic Parkinson's disease.    -The patient looks markedly improved on this current regimen.  He is on a lot of levodopa per day (1700 mg per day) but is currently side effect free.  He will continue the carbidopa/levodopa 25/250, 2 tablets at 6 AM/11 AM/3 PM and 50/200 at 8 PM.  -The patient has never tried an agonist, but currently he does not need additional therapy, so I did not give him anything further.  This is certainly in the consideration for the future.  -I talked to him today about the importance of continued  exercise, especially when his physical therapy is over.  Greater than 50% of the 25 minute visit in counseling. 2.  Return in about 4 months (around 05/08/2014).

## 2014-01-07 ENCOUNTER — Ambulatory Visit: Payer: Self-pay | Admitting: Physical Therapy

## 2014-01-07 ENCOUNTER — Encounter: Payer: Self-pay | Admitting: Occupational Therapy

## 2014-01-08 ENCOUNTER — Ambulatory Visit: Payer: Self-pay | Admitting: Physical Therapy

## 2014-01-12 ENCOUNTER — Ambulatory Visit: Payer: Self-pay | Admitting: Physical Therapy

## 2014-01-12 ENCOUNTER — Ambulatory Visit: Payer: Self-pay | Admitting: Occupational Therapy

## 2014-01-14 ENCOUNTER — Ambulatory Visit: Payer: Self-pay | Admitting: Occupational Therapy

## 2014-01-14 ENCOUNTER — Ambulatory Visit: Payer: Self-pay | Admitting: Physical Therapy

## 2014-01-21 ENCOUNTER — Ambulatory Visit: Payer: Self-pay | Admitting: Occupational Therapy

## 2014-01-21 ENCOUNTER — Ambulatory Visit: Payer: Self-pay | Admitting: Internal Medicine

## 2014-01-21 ENCOUNTER — Ambulatory Visit: Payer: Self-pay | Admitting: Physical Therapy

## 2014-01-23 ENCOUNTER — Ambulatory Visit: Payer: Self-pay | Admitting: Occupational Therapy

## 2014-01-23 ENCOUNTER — Ambulatory Visit: Payer: Self-pay | Attending: Neurology | Admitting: Physical Therapy

## 2014-01-23 DIAGNOSIS — R279 Unspecified lack of coordination: Secondary | ICD-10-CM | POA: Insufficient documentation

## 2014-01-23 DIAGNOSIS — G20 Parkinson's disease: Secondary | ICD-10-CM | POA: Insufficient documentation

## 2014-01-23 DIAGNOSIS — M2560 Stiffness of unspecified joint, not elsewhere classified: Secondary | ICD-10-CM | POA: Insufficient documentation

## 2014-01-27 ENCOUNTER — Ambulatory Visit: Payer: Self-pay | Admitting: Occupational Therapy

## 2014-01-27 ENCOUNTER — Ambulatory Visit: Payer: Self-pay | Admitting: Physical Therapy

## 2014-01-30 ENCOUNTER — Ambulatory Visit: Payer: Self-pay | Admitting: Physical Therapy

## 2014-01-30 ENCOUNTER — Ambulatory Visit: Payer: Self-pay | Admitting: Occupational Therapy

## 2014-02-03 ENCOUNTER — Ambulatory Visit: Payer: Self-pay | Admitting: Occupational Therapy

## 2014-02-03 ENCOUNTER — Ambulatory Visit: Payer: Self-pay | Admitting: Physical Therapy

## 2014-02-05 ENCOUNTER — Ambulatory Visit: Payer: Self-pay | Admitting: Physical Therapy

## 2014-02-05 ENCOUNTER — Encounter: Payer: Self-pay | Admitting: Occupational Therapy

## 2014-04-01 ENCOUNTER — Telehealth: Payer: Self-pay | Admitting: Neurology

## 2014-04-01 MED ORDER — CARBIDOPA-LEVODOPA 25-250 MG PO TABS: 2.0000 | ORAL_TABLET | Freq: Three times a day (TID) | ORAL | Status: DC

## 2014-04-01 MED ORDER — CARBIDOPA-LEVODOPA ER 50-200 MG PO TBCR: 1.0000 | EXTENDED_RELEASE_TABLET | Freq: Every day | ORAL | Status: DC

## 2014-04-01 NOTE — Telephone Encounter (Signed)
Miguel, pt's friend called requesting a refill for pt's SINEMET script Pharmacy: [facility]  C/B [phone]

## 2014-04-01 NOTE — Telephone Encounter (Signed)
Carbidopa Levodopa 25/250 and 50/200 refill requested. Per last office note- patient to remain on medication. Refill approved and sent to patient's pharmacy.

## 2014-05-08 ENCOUNTER — Ambulatory Visit: Payer: Self-pay | Admitting: Neurology

## 2014-05-12 ENCOUNTER — Telehealth: Payer: Self-pay | Admitting: Neurology

## 2014-05-12 NOTE — Telephone Encounter (Signed)
Pt no showed 05/08/14 appt w/ Dr. Arbutus Leasat. No show letter not sent as pt has already called to r/s appt w/ Dr. Arbutus Leasat / Oneita KrasSherri S.

## 2014-06-05 ENCOUNTER — Ambulatory Visit: Payer: Self-pay | Attending: Internal Medicine

## 2014-06-26 ENCOUNTER — Ambulatory Visit: Payer: Self-pay | Admitting: Neurology

## 2014-07-07 ENCOUNTER — Ambulatory Visit: Payer: Self-pay | Attending: Internal Medicine | Admitting: Internal Medicine

## 2014-07-07 ENCOUNTER — Encounter: Payer: Self-pay | Admitting: Internal Medicine

## 2014-07-07 VITALS — BP 119/71 | HR 76 | Temp 98.9°F | Resp 15 | Wt 160.0 lb

## 2014-07-07 DIAGNOSIS — G20 Parkinson's disease: Secondary | ICD-10-CM | POA: Insufficient documentation

## 2014-07-07 DIAGNOSIS — R7301 Impaired fasting glucose: Secondary | ICD-10-CM | POA: Insufficient documentation

## 2014-07-07 LAB — COMPLETE METABOLIC PANEL WITH GFR
ALK PHOS: 61 U/L (ref 39–117)
ALT: <8 U/L (ref 0–53)
AST: 14 U/L (ref 0–37)
Albumin: 3.3 g/dL — ABNORMAL LOW (ref 3.5–5.2)
BILIRUBIN TOTAL: 0.4 mg/dL (ref 0.2–1.2)
BUN: 12 mg/dL (ref 6–23)
CO2: 27 mEq/L (ref 19–32)
Calcium: 8.9 mg/dL (ref 8.4–10.5)
Chloride: 107 mEq/L (ref 96–112)
Creat: 0.63 mg/dL (ref 0.50–1.35)
GFR, Est African American: >89 mL/min
GLUCOSE: 108 mg/dL — AB (ref 70–99)
Potassium: 3.9 mEq/L (ref 3.5–5.3)
SODIUM: 140 meq/L (ref 135–145)
Total Protein: 6.9 g/dL (ref 6.0–8.3)

## 2014-07-07 MED ORDER — CARBIDOPA-LEVODOPA ER 50-200 MG PO TBCR: 1.0000 | EXTENDED_RELEASE_TABLET | Freq: Every day | ORAL | Status: DC

## 2014-07-07 NOTE — Progress Notes (Signed)
MRN: [id number] Name: Brandon Carrillo  Sex: male Age: 69 y.o. DOB: [birth year]  Allergies: Review of patient's allergies indicates no known allergies.  Chief Complaint  Patient presents with  . Follow-up    HPI: Patient is 69 y.o. male who has to of Parkinson's disease had been following up with the neurologist as per patient he has orange card expired and he could not see the neurologist today is requesting refill on one of his medication, denies any acute symptoms, his blood work reviewed with the patient noticed impaired fasting glucose.  Past Medical History  Diagnosis Date  . Depression   . Parkinson's disease     Past Surgical History  Procedure Laterality Date  . Leg surgery        Medication List       This list is accurate as of: 07/07/14  5:34 PM.  Always use your most recent med list.               carbidopa-levodopa 25-250 MG per tablet  Commonly known as:  SINEMET IR  Take 2 tablets by mouth 3 (three) times daily.     carbidopa-levodopa 50-200 MG per tablet  Commonly known as:  SINEMET CR  Take 1 tablet by mouth at bedtime.     multivitamin tablet  Take 1 tablet by mouth daily.     thiamine 100 MG tablet  Commonly known as:  VITAMIN B-1  Take 1 tablet (100 mg total) by mouth daily.        Meds ordered this encounter  Medications  . carbidopa-levodopa (SINEMET CR) 50-200 MG per tablet    Sig: Take 1 tablet by mouth at bedtime.    Dispense:  30 tablet    Refill:  2     There is no immunization history on file for this patient.  Family History  Problem Relation Age of Onset  . Diabetes Mother     History  Substance Use Topics  . Smoking status: Former Research scientist (life sciences)  . Smokeless tobacco: Not on file     Comment: as a teenager  . Alcohol Use: No    Review of Systems   As noted in HPI  Filed Vitals:   07/07/14 1635  BP: 119/71  Pulse: 76  Temp: 98.9 F (37.2 C)  Resp: 15    Physical Exam  Physical Exam    Constitutional: No distress.  Eyes: EOM are normal. Pupils are equal, round, and reactive to light.  Cardiovascular: Normal rate and regular rhythm.   Pulmonary/Chest: Breath sounds normal. No respiratory distress. He has no wheezes. He has no rales.    CBC    Component Value Date/Time   WBC 9.4 10/21/2013 1522   RBC 4.36 10/21/2013 1522   HGB 13.4 10/21/2013 1522   HCT 38.4* 10/21/2013 1522   PLT 332 10/21/2013 1522   MCV 88.1 10/21/2013 1522   LYMPHSABS 2.5 10/21/2013 1522   MONOABS 0.8 10/21/2013 1522   EOSABS 0.1 10/21/2013 1522   BASOSABS 0.0 10/21/2013 1522    CMP     Component Value Date/Time   NA 136 10/21/2013 1522   K 4.5 10/21/2013 1522   CL 103 10/21/2013 1522   CO2 29 10/21/2013 1522   GLUCOSE 101* 10/21/2013 1522   BUN 11 10/21/2013 1522   CREATININE 0.66 10/21/2013 1522   CREATININE 0.60 09/04/2009 1542   CALCIUM 9.1 10/21/2013 1522   PROT 7.1 10/21/2013 1522   ALBUMIN 4.0 10/21/2013 1522   AST  12 10/21/2013 1522   ALT <8 10/21/2013 1522   ALKPHOS 69 10/21/2013 1522   BILITOT 0.4 10/21/2013 1522   GFRNONAA >89 10/21/2013 1522   GFRNONAA >60 09/04/2009 1542   GFRAA >89 10/21/2013 1522   GFRAA  09/04/2009 1542    >60        The eGFR has been calculated using the MDRD equation. This calculation has not been validated in all clinical situations. eGFR's persistently <60 mL/min signify possible Chronic Kidney Disease.    Lab Results  Component Value Date/Time   CHOL 172 10/21/2013 03:22 PM    No components found for: HGA1C  Lab Results  Component Value Date/Time   AST 12 10/21/2013 03:22 PM    Assessment and Plan  Parkinson's disease - Plan: patient is given refill on the medication carbidopa-levodopa (SINEMET CR) 50-200 MG per tablet, he will schedule followup appointment with his neurologist.  IFG (impaired fasting glucose) - Plan:  Recheck COMPLETE METABOLIC PANEL WITH GFR, Hemoglobin A1c   Health  Maintenance  -Vaccinations:  As per patient he already got the flu shot this season  Return in about 4 months (around 11/06/2014), or if symptoms worsen or fail to improve.   This note has been created with Surveyor, quantity. Any transcriptional errors are unintentional.    Lorayne Marek, MD

## 2014-07-07 NOTE — Progress Notes (Signed)
Patient here with interpreter-Belen Here for follow up on his parkinson's And medication refills

## 2014-07-08 LAB — HEMOGLOBIN A1C
Hgb A1c MFr Bld: 6.2 % — ABNORMAL HIGH (ref <5.7)
MEAN PLASMA GLUCOSE: 131 mg/dL — AB (ref <117)

## 2014-07-09 ENCOUNTER — Telehealth: Payer: Self-pay

## 2014-07-09 NOTE — Telephone Encounter (Signed)
-----   Message from Doris Cheadleeepak Advani, MD sent at 07/08/2014  9:46 AM EDT ----- Blood work reviewed noticed hemoglobin A1c of 6.2%, patient has prediabetes, call and advise patient for low carbohydrate diet.

## 2014-07-09 NOTE — Telephone Encounter (Signed)
Spoke with friend Brandon Carrillo and he is aware of his lab results

## 2014-08-03 ENCOUNTER — Ambulatory Visit: Payer: Self-pay | Admitting: Neurology

## 2014-08-30 ENCOUNTER — Emergency Department (HOSPITAL_COMMUNITY): Payer: Medicaid Other

## 2014-08-30 ENCOUNTER — Encounter (HOSPITAL_COMMUNITY): Payer: Self-pay | Admitting: Emergency Medicine

## 2014-08-30 ENCOUNTER — Inpatient Hospital Stay (HOSPITAL_COMMUNITY): Payer: Medicaid Other

## 2014-08-30 ENCOUNTER — Inpatient Hospital Stay (HOSPITAL_COMMUNITY)
Admission: EM | Admit: 2014-08-30 | Discharge: 2014-09-01 | DRG: 281 | Disposition: A | Discharge status: Home / Self Care | Payer: Medicaid Other | Attending: Cardiovascular Disease | Admitting: Cardiovascular Disease

## 2014-08-30 DIAGNOSIS — I214 Non-ST elevation (NSTEMI) myocardial infarction: Principal | ICD-10-CM | POA: Diagnosis present

## 2014-08-30 DIAGNOSIS — G20 Parkinson's disease: Secondary | ICD-10-CM | POA: Diagnosis present

## 2014-08-30 DIAGNOSIS — D62 Acute posthemorrhagic anemia: Secondary | ICD-10-CM | POA: Diagnosis present

## 2014-08-30 DIAGNOSIS — I10 Essential (primary) hypertension: Secondary | ICD-10-CM | POA: Diagnosis present

## 2014-08-30 DIAGNOSIS — S01311A Laceration without foreign body of right ear, initial encounter: Secondary | ICD-10-CM | POA: Diagnosis present

## 2014-08-30 DIAGNOSIS — E861 Hypovolemia: Secondary | ICD-10-CM | POA: Diagnosis present

## 2014-08-30 DIAGNOSIS — Z7982 Long term (current) use of aspirin: Secondary | ICD-10-CM

## 2014-08-30 DIAGNOSIS — Z79899 Other long term (current) drug therapy: Secondary | ICD-10-CM

## 2014-08-30 DIAGNOSIS — R079 Chest pain, unspecified: Secondary | ICD-10-CM | POA: Diagnosis present

## 2014-08-30 DIAGNOSIS — S61511A Laceration without foreign body of right wrist, initial encounter: Secondary | ICD-10-CM | POA: Diagnosis present

## 2014-08-30 DIAGNOSIS — W01110A Fall on same level from slipping, tripping and stumbling with subsequent striking against sharp glass, initial encounter: Secondary | ICD-10-CM | POA: Diagnosis present

## 2014-08-30 DIAGNOSIS — S01319A Laceration without foreign body of unspecified ear, initial encounter: Secondary | ICD-10-CM

## 2014-08-30 DIAGNOSIS — S51811A Laceration without foreign body of right forearm, initial encounter: Secondary | ICD-10-CM | POA: Diagnosis present

## 2014-08-30 DIAGNOSIS — Y92009 Unspecified place in unspecified non-institutional (private) residence as the place of occurrence of the external cause: Secondary | ICD-10-CM

## 2014-08-30 DIAGNOSIS — Z87891 Personal history of nicotine dependence: Secondary | ICD-10-CM

## 2014-08-30 DIAGNOSIS — W19XXXA Unspecified fall, initial encounter: Secondary | ICD-10-CM

## 2014-08-30 DIAGNOSIS — E119 Type 2 diabetes mellitus without complications: Secondary | ICD-10-CM | POA: Diagnosis present

## 2014-08-30 LAB — I-STAT CHEM 8, ED
BUN: 19 mg/dL (ref 6–20)
CHLORIDE: 105 mmol/L (ref 101–111)
Calcium, Ion: 1.15 mmol/L (ref 1.13–1.30)
Creatinine, Ser: 0.8 mg/dL (ref 0.61–1.24)
GLUCOSE: 111 mg/dL — AB (ref 70–99)
HCT: 42 % (ref 39.0–52.0)
Hemoglobin: 14.3 g/dL (ref 13.0–17.0)
Potassium: 4.1 mmol/L (ref 3.5–5.1)
Sodium: 145 mmol/L (ref 135–145)
TCO2: 20 mmol/L (ref 0–100)

## 2014-08-30 LAB — CBC
HCT: 35.8 % — ABNORMAL LOW (ref 39.0–52.0)
Hemoglobin: 11.9 g/dL — ABNORMAL LOW (ref 13.0–17.0)
MCH: 30.6 pg (ref 26.0–34.0)
MCHC: 33.2 g/dL (ref 30.0–36.0)
MCV: 92 fL (ref 78.0–100.0)
PLATELETS: 262 10*3/uL (ref 150–400)
RBC: 3.89 MIL/uL — ABNORMAL LOW (ref 4.22–5.81)
RDW: 13.8 % (ref 11.5–15.5)
WBC: 17.1 10*3/uL — ABNORMAL HIGH (ref 4.0–10.5)

## 2014-08-30 LAB — ABO/RH: ABO/RH(D): O POS

## 2014-08-30 LAB — RAPID HIV SCREEN (HIV 1/2 AB+AG)
HIV 1/2 Antibodies: NONREACTIVE
HIV-1 P24 Antigen - HIV24: NONREACTIVE

## 2014-08-30 LAB — I-STAT TROPONIN, ED
TROPONIN I, POC: 0.05 ng/mL (ref 0.00–0.08)
TROPONIN I, POC: 0.48 ng/mL — AB (ref 0.00–0.08)

## 2014-08-30 LAB — PROTIME-INR
INR: 1.07 (ref 0.00–1.49)
Prothrombin Time: 14.1 seconds (ref 11.6–15.2)

## 2014-08-30 LAB — CBG MONITORING, ED: Glucose-Capillary: 127 mg/dL — ABNORMAL HIGH (ref 70–99)

## 2014-08-30 MED ORDER — ONDANSETRON HCL 4 MG/2ML IJ SOLN
4.0000 mg | Freq: Four times a day (QID) | INTRAMUSCULAR | Status: DC | PRN
  Administered 2014-08-31: 4 mg via INTRAVENOUS
  Filled 2014-08-30: qty 2

## 2014-08-30 MED ORDER — CEFAZOLIN SODIUM 1-5 GM-% IV SOLN
1.0000 g | Freq: Once | INTRAVENOUS | Status: AC
  Administered 2014-08-30: 1 g via INTRAVENOUS
  Filled 2014-08-30: qty 50

## 2014-08-30 MED ORDER — SODIUM CHLORIDE 0.9 % IV BOLUS (SEPSIS)
1000.0000 mL | Freq: Once | INTRAVENOUS | Status: AC
  Administered 2014-08-30: 1000 mL via INTRAVENOUS

## 2014-08-30 MED ORDER — CARBIDOPA-LEVODOPA ER 50-200 MG PO TBCR
1.0000 | EXTENDED_RELEASE_TABLET | Freq: Every day | ORAL | Status: DC
  Administered 2014-08-31: 1 via ORAL
  Filled 2014-08-30 (×4): qty 1

## 2014-08-30 MED ORDER — ASPIRIN EC 325 MG PO TBEC
325.0000 mg | DELAYED_RELEASE_TABLET | Freq: Once | ORAL | Status: AC
  Administered 2014-08-30: 325 mg via ORAL
  Filled 2014-08-30: qty 1

## 2014-08-30 MED ORDER — TETANUS-DIPHTH-ACELL PERTUSSIS 5-2.5-18.5 LF-MCG/0.5 IM SUSP
0.5000 mL | Freq: Once | INTRAMUSCULAR | Status: AC
  Administered 2014-08-30: 0.5 mL via INTRAMUSCULAR
  Filled 2014-08-30: qty 0.5

## 2014-08-30 MED ORDER — NITROGLYCERIN 0.4 MG SL SUBL
0.4000 mg | SUBLINGUAL_TABLET | SUBLINGUAL | Status: DC | PRN
  Administered 2014-08-30: 0.4 mg via SUBLINGUAL
  Filled 2014-08-30: qty 1

## 2014-08-30 MED ORDER — LIDOCAINE-EPINEPHRINE (PF) 2 %-1:200000 IJ SOLN
10.0000 mL | Freq: Once | INTRAMUSCULAR | Status: AC
  Administered 2014-08-30: 10 mL via INTRADERMAL

## 2014-08-30 MED ORDER — ACETAMINOPHEN 325 MG PO TABS: 650.0000 mg | ORAL_TABLET | ORAL | Status: DC | PRN

## 2014-08-30 MED ORDER — ATORVASTATIN CALCIUM 40 MG PO TABS
40.0000 mg | ORAL_TABLET | Freq: Every day | ORAL | Status: DC
  Administered 2014-08-31 – 2014-09-01 (×2): 40 mg via ORAL
  Filled 2014-08-30 (×2): qty 1

## 2014-08-30 MED ORDER — METOPROLOL TARTRATE 25 MG PO TABS
25.0000 mg | ORAL_TABLET | Freq: Two times a day (BID) | ORAL | Status: DC
  Administered 2014-08-31: 25 mg via ORAL
  Filled 2014-08-30 (×2): qty 1

## 2014-08-30 MED ORDER — NITROGLYCERIN IN D5W 200-5 MCG/ML-% IV SOLN
0.0000 ug/min | INTRAVENOUS | Status: DC
Start: 2014-08-30 — End: 2014-09-01
  Administered 2014-08-30: 5 ug/min via INTRAVENOUS
  Filled 2014-08-30: qty 250

## 2014-08-30 MED ORDER — LORAZEPAM 1 MG PO TABS
1.0000 mg | ORAL_TABLET | Freq: Once | ORAL | Status: AC
  Administered 2014-08-30: 1 mg via ORAL
  Filled 2014-08-30: qty 1

## 2014-08-30 MED ORDER — CARBIDOPA-LEVODOPA 25-250 MG PO TABS
2.0000 | ORAL_TABLET | Freq: Three times a day (TID) | ORAL | Status: DC
  Administered 2014-08-31 – 2014-09-01 (×6): 2 via ORAL
  Filled 2014-08-30 (×9): qty 2

## 2014-08-30 MED ORDER — ASPIRIN EC 81 MG PO TBEC
81.0000 mg | DELAYED_RELEASE_TABLET | Freq: Every day | ORAL | Status: DC
  Administered 2014-08-31 – 2014-09-01 (×2): 81 mg via ORAL
  Filled 2014-08-30 (×2): qty 1

## 2014-08-30 NOTE — ED Notes (Signed)
CBG 127 

## 2014-08-30 NOTE — H&P (Addendum)
PCP:   Doris CheadleADVANI, DEEPAK, MD  Cardiologist: None  Chief Complaint: Chest pain  HPI: This is 69 year old Hispanic male with past medical history of parkinsonism and possible past medical history of hypertension who was brought in by EMS to the emergency department after he sustained a mechanical fall and fell over a glass bowl on his porch. Patient has severe laceration of his right ureter as well as right forearm. According to EMS he lost "2 pints of blood". His lacerations were repaired by emergency department additions and ENT. However patient started complaining of chest pain had diaphoresis. His initial troponin on presentation was negative however follow-up troponin 3 hours later was elevated up to 0.48. Therefore cardiology was consulted for admission. At the time of my evaluation patient appears to be uncomfortable, he has severe tremors of bilateral upper extremities which are apparently worse than normal secondary to stressful situation. Patient reported 6 out of 10 chest pain, pulsating, left-sided. By the time I arrived patient was on nitroglycerin drip and I uptitrated during the interview area which resulted in improvement of patient's chest pain from 6/10 to 2/10.  Patient's EKG was uninterpretable due to severe tremor in his upper extremities. Limited bedside echo performed by me in the ED didn't show on this regional wall motion abnormalities. Patient had significant hemoglobin drop by ~ 3 g while being in the emergency department. And even though significant bleeding stopped the decision was made to give aspirin only and not to start a heparin drip.   Review of Systems:  All systems were reviewed and were negative except mentioned in history of present illness  Past Medical History: Past Medical History  Diagnosis Date  . Depression   . Parkinson's disease    Past Surgical History  Procedure Laterality Date  . Leg surgery      Medications: Prior to Admission medications     Medication Sig Start Date End Date Taking? Authorizing Provider  carbidopa-levodopa (SINEMET CR) 50-200 MG per tablet Take 1 tablet by mouth at bedtime. 07/07/14  Yes Doris Cheadleeepak Advani, MD  carbidopa-levodopa (SINEMET IR) 25-250 MG per tablet Take 2 tablets by mouth 3 (three) times daily. Patient taking differently: Take 2 tablets by mouth 3 (three) times daily with meals.  04/01/14  Yes Rebecca S Tat, DO  Multiple Vitamins-Minerals (MULTIVITAMIN) tablet Take 1 tablet by mouth daily. Patient not taking: Reported on 08/30/2014 10/21/13   Quentin Angstlugbemiga E Jegede, MD  thiamine (VITAMIN B-1) 100 MG tablet Take 1 tablet (100 mg total) by mouth daily. Patient not taking: Reported on 08/30/2014 10/21/13   Quentin Angstlugbemiga E Jegede, MD    Allergies:  No Known Allergies  Social History:  reports that he has quit smoking. He does not have any smokeless tobacco history on file. He reports that he does not drink alcohol or use illicit drugs.  Family History: Family History  Problem Relation Age of Onset  . Diabetes Mother     PHYSICAL EXAM:  Filed Vitals:   08/30/14 2015 08/30/14 2030 08/30/14 2100 08/30/14 2130  BP: 107/80 116/66 147/81 135/56  Pulse: 85 85 88 80  Temp:      TempSrc:      Resp:  21 20 19   SpO2: 96% 97% 97% 98%   General:  Anxious, severe tremor HEENT: Right ear laceration, with stitches Neck: supple. no JVD. Carotids 2+ bilat; no bruits. No lymphadenopathy or thryomegaly appreciated. Cor: Regular rate & rhythm. No rubs, gallops or murmurs. No apparent chest wall tenderness Lungs:  Coarse breath sounds bilaterally Abdomen: soft, nontender, nondistended. No hepatosplenomegaly. No bruits or masses. Good bowel sounds. Extremities: no cyanosis, clubbing, rash, edema. Laceration of the right upper extremity covered with dressing Neuro: alert & oriented x 3, cranial nerves grossly intact. Severe tremor of bilateral upper extremities  Labs on Admission:   Recent Labs  08/30/14 1558  NA 145   K 4.1  CL 105  GLUCOSE 111*  BUN 19  CREATININE 0.80   No results for input(s): AST, ALT, ALKPHOS, BILITOT, PROT, ALBUMIN in the last 72 hours. No results for input(s): LIPASE, AMYLASE in the last 72 hours.  Recent Labs  08/30/14 1558 08/30/14 1913  WBC  --  17.1*  HGB 14.3 11.9*  HCT 42.0 35.8*  MCV  --  92.0  PLT  --  262   No results for input(s): CKTOTAL, CKMB, CKMBINDEX, TROPONINI in the last 72 hours. No results for input(s): TSH, T4TOTAL, T3FREE, THYROIDAB in the last 72 hours.  Invalid input(s): FREET3 No results for input(s): VITAMINB12, FOLATE, FERRITIN, TIBC, IRON, RETICCTPCT in the last 72 hours.  Radiological Exams on Admission (all images were personally reviewed and interpreted by me, radiology reports were reviewed as well): Dg Wrist Complete Right  08/30/2014   CLINICAL DATA:  Felt for a glass table at home, with multiple lacerations.  EXAM: RIGHT WRIST - COMPLETE 3+ VIEW  COMPARISON:  None.  FINDINGS: Negative for fracture, dislocation or radiopaque foreign body.  IMPRESSION: Negative.   Electronically Signed   By: Ellery Plunkaniel R Mitchell M.D.   On: 08/30/2014 19:44   Ct Head Wo Contrast  08/30/2014   CLINICAL DATA:  Recent fall onto glass table with right ear wound, initial encounter  EXAM: CT HEAD WITHOUT CONTRAST  CT CERVICAL SPINE WITHOUT CONTRAST  TECHNIQUE: Multidetector CT imaging of the head and cervical spine was performed following the standard protocol without intravenous contrast. Multiplanar CT image reconstructions of the cervical spine were also generated.  COMPARISON:  09/04/2009  FINDINGS: CT HEAD FINDINGS  Bony calvarium is intact. No radiopaque foreign body is identified. There are soft tissue changes along the right temporal region consistent with a recent injury. Soft tissue density is noted within the external auditory canal which may be related to cerumen although could be related thrombus from the recent hemorrhage and injury. Atrophic changes are  seen. No findings to suggest acute hemorrhage, acute infarction or space-occupying mass lesion are noted.  CT CERVICAL SPINE FINDINGS  Seven cervical segments are well visualized. Vertebral body height is well maintained. Disc space narrowing is noted at C6-7. Mild facet hypertrophic changes and osteophytic changes are seen. No acute fracture or acute facet abnormality is noted. Surrounding soft tissues are within normal limits.  IMPRESSION: CT of the head: Soft tissue changes consistent with the recent injury about the right ear.  Atrophic changes without acute abnormality.  CT of the cervical spine: Mild degenerative changes without acute abnormality.   Electronically Signed   By: Alcide CleverMark  Lukens M.D.   On: 08/30/2014 18:20   Ct Cervical Spine Wo Contrast  08/30/2014   CLINICAL DATA:  Recent fall onto glass table with right ear wound, initial encounter  EXAM: CT HEAD WITHOUT CONTRAST  CT CERVICAL SPINE WITHOUT CONTRAST  TECHNIQUE: Multidetector CT imaging of the head and cervical spine was performed following the standard protocol without intravenous contrast. Multiplanar CT image reconstructions of the cervical spine were also generated.  COMPARISON:  09/04/2009  FINDINGS: CT HEAD FINDINGS  Bony calvarium is  intact. No radiopaque foreign body is identified. There are soft tissue changes along the right temporal region consistent with a recent injury. Soft tissue density is noted within the external auditory canal which may be related to cerumen although could be related thrombus from the recent hemorrhage and injury. Atrophic changes are seen. No findings to suggest acute hemorrhage, acute infarction or space-occupying mass lesion are noted.  CT CERVICAL SPINE FINDINGS  Seven cervical segments are well visualized. Vertebral body height is well maintained. Disc space narrowing is noted at C6-7. Mild facet hypertrophic changes and osteophytic changes are seen. No acute fracture or acute facet abnormality is noted.  Surrounding soft tissues are within normal limits.  IMPRESSION: CT of the head: Soft tissue changes consistent with the recent injury about the right ear.  Atrophic changes without acute abnormality.  CT of the cervical spine: Mild degenerative changes without acute abnormality.   Electronically Signed   By: Alcide Clever M.D.   On: 08/30/2014 18:20   Dg Hand Complete Right  08/30/2014   CLINICAL DATA:  Larey Seat through glass table at home. Multiple lacerations.  EXAM: RIGHT HAND - COMPLETE 3+ VIEW  COMPARISON:  None.  FINDINGS: The film suffer from some motion degradation. No evidence of radiopaque foreign object or acute fracture. There is probably an old fracture of the middle phalanx of the long finger distally. 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  IMPRESSION: No acute finding by radiography. No radiopaque foreign object or acute fracture.   Electronically Signed   By: Paulina Fusi M.D.   On: 08/30/2014 19:44    Bedside ECHO: Grossly normal LV and RV function, no pericardial effusion, no apparent regional wall motion abnormalities EKG personally reviewed and interpreted by me: very difficult to interpret due to tremor. Tachycardiac 110 per min, no obvious ST segment elevation.  Assessment/discussion and plan:  Present on Admission:  . NSTEMI (non-ST elevated myocardial infarction) Patient has ongoing chest pain that is difficult to interpret as he sustained mechanical fall and reportedly keep his chest. Even though he doesn't have any obvious bruising's or significant tenderness on chest palpation to suspect myocardial contusion (ever this still needs to be a consideration for this). I'm not able to interpret his EKG to completely rule out ST segment deviation even though it does not appear that he has apparent STEMI. Reassuringly patient doesn't have regional wall motion abnormalities that I was able to identified on my bedside echocardiogram. Patient is also Spanish-speaking which makes communication with him  difficult. Therefore we will admit him to react intensive care unit for close monitoring. - Continue nitroglycerin drip titrate to chest pain relief or blood pressure less than 100 mmHg systolic - Start metoprolol 25 mg twice a day - Emergency Department we'll administer Ativan 1 mg by mouth 1 and obtain 2 view chest x-ray per my request - Echocardiogram in the morning - Cycle troponins, repeat CBC, TSH, hemoglobin A1c, lipid profile - Start Lipitor 40 mg daily - Patient was given full dose aspirin in the emergency department, continue 81 mg aspirin daily. We'll hold off on starting heparin drip for now on less cardiac enzymes continue to up trend. Patient had recent ear laceration with significant blood loss.  Status post fall with ear laceration. - Recommend routine Ice, elevation, analgesia, wound hygiene, OK to shower. Recheck my office 8-10 days for suture removal. Per ENT recommendations.  At 12:26 AM on 08/31/2014 was called by the patient's bedside RN who reported that patient's blood pressure is  now 70/40 mm Hg.  Patient received 2.5 L of normal saline bolus under assumption that he was hypovolemic to begin with, his hemoglobin level underestimated the degree of his true blood loss and now once his stress related catecholamine surge resolved patient's became hypotensive. Patient continued with blood pressure 88/50 mmHg an map of 61 despite saline boluses. Decision was to proceed with one unit of packed red blood cells. We'll also give 2 g of calcium gluconate. Repeat troponin came back as 0.81. Patient continued with mild chest pain however appeared overall comfortable. Repeat EKG when patient didn't have tremor showed normal sinus rhythm heart rate 61 bpm, normal axis no evidence of ST-T segment changes consistent with acute ischemia. Repeat echocardiogram showed normal EF, no effusion, normal size inferior vena cava with somewhat decreased respiratory variation. We now temporal not,  cardiogenic shock up, significant hypovolemia.   We'll continue to monitor. Critical care time 75 minutes CPT code 16109  Nolon Nations 08/30/2014, 10:24 PM

## 2014-08-30 NOTE — ED Provider Notes (Signed)
CSN: [id number]     Arrival date & time 08/30/14  1505 History   First MD Initiated Contact with Patient 08/30/14 1506     Chief Complaint  Patient presents with  . Fall  . Ear Laceration     (Consider location/radiation/quality/duration/timing/severity/associated sxs/prior Treatment) Patient is a 69 y.o. male presenting with fall. The history is provided by the patient and a relative. A language interpreter was used.  Fall This is a new problem. The current episode started today. The problem occurs constantly. The problem has been unchanged. Associated symptoms include chest pain and diaphoresis. Pertinent negatives include no abdominal pain, chills, coughing, fatigue, fever, headaches, nausea, neck pain, numbness, rash, vertigo, visual change, vomiting or weakness. Associated symptoms comments: R ear pain, laceration. Nothing aggravates the symptoms. He has tried nothing for the symptoms. The treatment provided no relief.    Past Medical History  Diagnosis Date  . Depression   . Parkinson's disease    Past Surgical History  Procedure Laterality Date  . Leg surgery     Family History  Problem Relation Age of Onset  . Diabetes Mother    History  Substance Use Topics  . Smoking status: Former Games developer  . Smokeless tobacco: Not on file     Comment: as a teenager  . Alcohol Use: No    Review of Systems  Constitutional: Positive for diaphoresis. Negative for fever, chills and fatigue.  Eyes: Negative for photophobia and visual disturbance.  Respiratory: Negative for cough, chest tightness and shortness of breath.   Cardiovascular: Positive for chest pain. Negative for palpitations and leg swelling.  Gastrointestinal: Negative for nausea, vomiting, abdominal pain, diarrhea and abdominal distention.  Musculoskeletal: Negative for neck pain.  Skin: Positive for wound (R ear laceration). Negative for color change, pallor and rash.  Neurological: Negative for dizziness, vertigo,  syncope, weakness, light-headedness, numbness and headaches.  All other systems reviewed and are negative.     Allergies  Review of patient's allergies indicates no known allergies.  Home Medications   Prior to Admission medications   Medication Sig Start Date End Date Taking? Authorizing Provider  carbidopa-levodopa (SINEMET CR) 50-200 MG per tablet Take 1 tablet by mouth at bedtime. 07/07/14  Yes Doris Cheadle, MD  carbidopa-levodopa (SINEMET IR) 25-250 MG per tablet Take 2 tablets by mouth 3 (three) times daily. Patient taking differently: Take 2 tablets by mouth 3 (three) times daily with meals.  04/01/14  Yes Rebecca S Tat, DO  aspirin EC 81 MG EC tablet Take 1 tablet (81 mg total) by mouth daily. 09/01/14   Yolanda Manges, DO  atorvastatin (LIPITOR) 40 MG tablet Take 1 tablet (40 mg total) by mouth daily at 6 PM. 09/01/14   Yolanda Manges, DO  carvedilol (COREG) 3.125 MG tablet Take 1 tablet (3.125 mg total) by mouth 2 (two) times daily with a meal. 09/01/14   Yolanda Manges, DO  losartan (COZAAR) 25 MG tablet Take 1 tablet (25 mg total) by mouth daily. 09/01/14   Yolanda Manges, DO  Multiple Vitamins-Minerals (MULTIVITAMIN) tablet Take 1 tablet by mouth daily. Patient not taking: Reported on 08/30/2014 10/21/13   Quentin Angst, MD  thiamine (VITAMIN B-1) 100 MG tablet Take 1 tablet (100 mg total) by mouth daily. Patient not taking: Reported on 08/30/2014 10/21/13   Quentin Angst, MD   BP 113/59 mmHg  Pulse 60  Temp(Src) 98.4 F (36.9 C) (Oral)  Resp 11  Ht  (1.676 m)  Wt 161 lb 2.5 oz (73.1 kg)  BMI 26.02 kg/m2  SpO2 100% Physical Exam  Constitutional: He is oriented to person, place, and time. He appears well-developed and well-nourished. No distress.  HENT:  Head: Normocephalic.  Right Ear: Right ear exhibits lacerations.  Ears:  Mouth/Throat: Oropharynx is clear and moist.  Complex R ear laceration- near complete avulsion of upper portion of helix;  Moderate  venous bleeding  Eyes: Conjunctivae and EOM are normal. Pupils are equal, round, and reactive to light.  Neck: Normal range of motion. Neck supple.  Cardiovascular: Regular rhythm, normal heart sounds and intact distal pulses.  Tachycardia present.  Exam reveals no gallop and no friction rub.   No murmur heard. Pulmonary/Chest: Effort normal and breath sounds normal. No respiratory distress. He has no wheezes. He has no rales.  Abdominal: Soft. Bowel sounds are normal. He exhibits no distension. There is no tenderness. There is no rebound and no guarding.  Musculoskeletal: Normal range of motion. He exhibits no edema or tenderness.       Right wrist: He exhibits laceration (small superficial laceration to R dorsal wrist). He exhibits normal range of motion, no tenderness, no bony tenderness, no swelling and no deformity.  Neurological: He is alert and oriented to person, place, and time. He has normal strength. He displays tremor (b/l UE tremors 2/2 Parkinson's disease). No cranial nerve deficit or sensory deficit. GCS eye subscore is 4. GCS verbal subscore is 5. GCS motor subscore is 6.  Skin: Skin is warm. No rash noted. He is diaphoretic. No erythema. No pallor.  Nursing note and vitals reviewed.   ED Course  Procedures (including critical care time) Labs Review Labs Reviewed  CBC - Abnormal; Notable for the following:    WBC 17.1 (*)    RBC 3.89 (*)    Hemoglobin 11.9 (*)    HCT 35.8 (*)    All other components within normal limits  HEMOGLOBIN A1C - Abnormal; Notable for the following:    Hgb A1c MFr Bld 6.8 (*)    All other components within normal limits  TROPONIN I - Abnormal; Notable for the following:    Troponin I 0.81 (*)    All other components within normal limits  TROPONIN I - Abnormal; Notable for the following:    Troponin I 0.65 (*)    All other components within normal limits  TROPONIN I - Abnormal; Notable for the following:    Troponin I 0.42 (*)    All other  components within normal limits  CBC WITH DIFFERENTIAL/PLATELET - Abnormal; Notable for the following:    WBC 14.1 (*)    RBC 3.46 (*)    Hemoglobin 10.8 (*)    HCT 32.0 (*)    Neutrophils Relative % 81 (*)    Neutro Abs 11.4 (*)    Lymphocytes Relative 10 (*)    Monocytes Absolute 1.2 (*)    All other components within normal limits  CBC - Abnormal; Notable for the following:    WBC 12.0 (*)    RBC 3.39 (*)    Hemoglobin 10.4 (*)    HCT 31.6 (*)    All other components within normal limits  LIPID PANEL - Abnormal; Notable for the following:    HDL 34 (*)    All other components within normal limits  CBC - Abnormal; Notable for the following:    RBC 3.59 (*)    Hemoglobin 11.0 (*)    HCT 33.4 (*)  All other components within normal limits  BASIC METABOLIC PANEL - Abnormal; Notable for the following:    Chloride 112 (*)    CO2 20 (*)    Glucose, Bld 169 (*)    Creatinine, Ser 0.60 (*)    Calcium 8.1 (*)    All other components within normal limits  CBC - Abnormal; Notable for the following:    RBC 3.61 (*)    Hemoglobin 11.3 (*)    HCT 34.3 (*)    All other components within normal limits  BASIC METABOLIC PANEL - Abnormal; Notable for the following:    Chloride 112 (*)    Calcium 8.2 (*)    All other components within normal limits  I-STAT CHEM 8, ED - Abnormal; Notable for the following:    Glucose, Bld 111 (*)    All other components within normal limits  I-STAT TROPOININ, ED - Abnormal; Notable for the following:    Troponin i, poc 0.48 (*)    All other components within normal limits  CBG MONITORING, ED - Abnormal; Notable for the following:    Glucose-Capillary 127 (*)    All other components within normal limits  MRSA PCR SCREENING  PROTIME-INR  RAPID HIV SCREEN (HIV 1/2 AB+AG)  T4, FREE  TSH  MAGNESIUM  I-STAT TROPOININ, ED  TYPE AND SCREEN  ABO/RH  PREPARE RBC (CROSSMATCH)    Imaging Review No results found.   EKG  Interpretation   Date/Time:  Sunday Aug 30 2014 15:27:16 EDT Ventricular Rate:  117 PR Interval:  118 QRS Duration: 145 QT Interval:  336 QTC Calculation: 469 R Axis:   59 Text Interpretation:  Sinus tachycardia IVCD, consider atypical LBBB  Artifact in lead(s) I II III aVR aVL aVF V1 V2 V3 V4 V5 V6 and baseline  wander in lead(s) II III aVR aVL aVF Artifact nondiagnostic Confirmed by  Manus Gunning  MD, STEPHEN [phone]) on 08/30/2014 3:31:05 PM      MDM   Final diagnoses:  Fall    69 yo M with PMH of Parkinson's disease presenting s/p mechanical fall.  Pt tripped and fell forward into a glass table which shattered.  Sustained large complex R ear laceration.  EMS estimated 1-2 quart blood loss.  Pt denies LOC.  Currently complains of mild L-sided chest pain.  No SOB.    Pt diaphoretic, tachycardic on arrival but alert and mentating appropriately, normotensive.  Large laceration to R ear with near complete avulsion of upper portion of helix, smaller laceration to tragus.  Mild oozing from helix.  Small laceration to R dorsal wrist without bony deformity.  CV and lung exam WNL.  Severe b/l UE tremors due to Parkinson's disease but no focal neuro deficits.  Plan for CT head, c-spine;  Possible acute blood loss anemia vs ACS given tachycardia, diaphoresis- CBC, type and screen, troponin.  EKG with sinus tachycardia; unable to assess fully for ST changes given technically poor tracing 2/2 tremors, however no ST elevations visualized.. Fluids given.  No anti-coagulant use.  Sutures placed for hemostasis of R ear although continues to have moderate oozing from wound.  ENT consulted for repair.  CT head and c-spine ordered.  Ear repair performed at bedside per ENT. CT results negative for acute traumatic injury.  Pt remains diaphoretic with chest pain.  No anemia on initial CBC.  Troponin negative but detectable.  Will observe and repeat CBC and troponin.  NTG given for chest pain with transient  improvement.  Repeat troponin  now positive at 0.48.  Hgb down to 12 from 14.  Cardiology consulted for possible NSTEMI.  Pt now with recurrence of chest pain- started on NTG drip.  Cardiology to admit for further workup.  No other acute events during my care.  Discussed with attending Dr. Manus Gunningancour.    Jodean LimaEmily Anthon Harpole, MD 09/02/14 [id number]  Glynn OctaveStephen Rancour, MD 09/03/14 [phone]

## 2014-08-30 NOTE — Consult Note (Signed)
Brandon Carrillo,  Brandon Carrillo 69 y.o., male [id number]     Chief Complaint: RIGHT ear laceration  HPI: 69 yo hispanic male.  Parkinson's disease.  Fell onto and fractured glass table 2 hrs ago.  EMS estimated 1-2 qts blood loss!  No LOC.  ED placed a few stitches to staunch bleeding. ENT called for assistance with partial avulsion, RIGHT upper pinna.    PMH: Past Medical History  Diagnosis Date  . Depression   . Parkinson's disease     Surg Hx: Past Surgical History  Procedure Laterality Date  . Leg surgery      FHx:   Family History  Problem Relation Age of Onset  . Diabetes Mother    SocHx:  reports that he has quit smoking. He does not have any smokeless tobacco history on file. He reports that he does not drink alcohol or use illicit drugs.  ALLERGIES: No Known Allergies   (Not in a hospital admission)  Results for orders placed or performed during the hospital encounter of 08/30/14 (from the past 48 hour(s))  Type and screen     Status: None   Collection Time: 08/30/14  3:50 PM  Result Value Ref Range   ABO/RH(D) O POS    Antibody Screen NEG    Sample Expiration 09/02/2014   Protime-INR     Status: None   Collection Time: 08/30/14  3:50 PM  Result Value Ref Range   Prothrombin Time 14.1 11.6 - 15.2 seconds   INR 1.07 0.00 - 1.49  Rapid HIV screen (HIV 1/2 Ab+Ag)     Status: None   Collection Time: 08/30/14  3:50 PM  Result Value Ref Range   HIV-1 P24 Antigen - HIV24 NON REACTIVE NON REACTIVE   HIV 1/2 Antibodies NON REACTIVE NON REACTIVE   Interpretation (HIV Ag Ab)      A non reactive test result means that HIV 1 or HIV 2 antibodies and HIV 1 p24 antigen were not detected in the specimen.  I-stat troponin, ED     Status: None   Collection Time: 08/30/14  3:56 PM  Result Value Ref Range   Troponin i, poc 0.05 0.00 - 0.08 ng/mL   Comment 3            Comment: Due to the release kinetics of cTnI, a negative result within the first hours of the onset of symptoms  does not rule out myocardial infarction with certainty. If myocardial infarction is still suspected, repeat the test at appropriate intervals.   I-stat chem 8, ed     Status: Abnormal   Collection Time: 08/30/14  3:58 PM  Result Value Ref Range   Sodium 145 135 - 145 mmol/L   Potassium 4.1 3.5 - 5.1 mmol/L   Chloride 105 101 - 111 mmol/L   BUN 19 6 - 20 mg/dL   Creatinine, Ser 4.090.80 0.61 - 1.24 mg/dL   Glucose, Bld 811111 (H) 70 - 99 mg/dL   Calcium, Ion 9.141.15 7.821.13 - 1.30 mmol/L   TCO2 20 0 - 100 mmol/L   Hemoglobin 14.3 13.0 - 17.0 g/dL   HCT 95.642.0 21.339.0 - 08.652.0 %   No results found.   Blood pressure 174/79, pulse 113, temperature 99.7 F (37.6 C), temperature source Oral, resp. rate 24, SpO2 97 %.  PHYSICAL EXAM: Overall appearance:  Tremulous.blood soiling. Head:  NCAT Ears: transverse laceration through scalp and upper pinna, 6 cm in total length.  Pedunculated tissue from anterior helix 8 mm x 24 mm.  Not dusky.  Wax in both ear canals Nose:  Not examined. Oral Cavity:  Poor teeth Oral Pharynx/Hypopharynx/Larynx: not examined. Neuro:  tremulous Neck: clear   Assessment/Plan Scalp/ pinna laceration.  Discussed with family who translated to patient.  Informed consent obtained.  Closed laceration beginning with 1% xylocaine with 1:100,000 epinephrine, 18 ml total. Cleaned with 50:50 mix betadine solution and saline.  Small scalp bleeders cauterized with thermocautery.  Deep scalp closed with 4-0 chromic.  Pinna cartilage closed with figure of 8 5-0 chromic.  Sub Q skin and pinna skin closed with interrupted 5-0 Ethilon.  Good cosmetic configuration.  Hemostasis observed.  Recommend routine  Ice, elevation, analgesia,  wound hygiene, OK to shower.  Recheck my office 8-10 days for suture removal.  Lazarus SalinesWOLICKI, Shanitra Phillippi 08/30/2014, 5:29 PM

## 2014-08-30 NOTE — ED Notes (Signed)
Per EMS, pt coming in from home for fall onto a glass table. Pt has a wound to the right ear and right face, the top of the right ear appears to be cut completely through. Dressing applied by EMS, blood coming through 2 abd pads.  Pt alert x4. MD at the bedside.

## 2014-08-30 NOTE — ED Notes (Signed)
Pts right ear wound closed with sutures by ENT physician, wound cleaned and bacitracin placed on the wound.

## 2014-08-30 NOTE — ED Notes (Signed)
Cardiologist at bedside.  

## 2014-08-31 ENCOUNTER — Other Ambulatory Visit: Payer: Self-pay

## 2014-08-31 ENCOUNTER — Inpatient Hospital Stay (HOSPITAL_COMMUNITY): Payer: Medicaid Other

## 2014-08-31 DIAGNOSIS — I214 Non-ST elevation (NSTEMI) myocardial infarction: Secondary | ICD-10-CM | POA: Diagnosis not present

## 2014-08-31 DIAGNOSIS — S01319A Laceration without foreign body of unspecified ear, initial encounter: Secondary | ICD-10-CM

## 2014-08-31 DIAGNOSIS — R079 Chest pain, unspecified: Secondary | ICD-10-CM | POA: Diagnosis not present

## 2014-08-31 LAB — LIPID PANEL
CHOL/HDL RATIO: 3.3 ratio
Cholesterol: 112 mg/dL (ref 0–200)
HDL: 34 mg/dL — ABNORMAL LOW (ref >40)
LDL CALC: 74 mg/dL (ref 0–99)
Triglycerides: 21 mg/dL (ref <150)
VLDL: 4 mg/dL (ref 0–40)

## 2014-08-31 LAB — CBC
HCT: 33.4 % — ABNORMAL LOW (ref 39.0–52.0)
HEMATOCRIT: 31.6 % — AB (ref 39.0–52.0)
Hemoglobin: 10.4 g/dL — ABNORMAL LOW (ref 13.0–17.0)
Hemoglobin: 11 g/dL — ABNORMAL LOW (ref 13.0–17.0)
MCH: 30.7 pg (ref 26.0–34.0)
MCH: 30.6 pg (ref 26.0–34.0)
MCHC: 32.9 g/dL (ref 30.0–36.0)
MCHC: 32.9 g/dL (ref 30.0–36.0)
MCV: 93.2 fL (ref 78.0–100.0)
MCV: 93 fL (ref 78.0–100.0)
PLATELETS: 210 10*3/uL (ref 150–400)
Platelets: 202 10*3/uL (ref 150–400)
RBC: 3.39 MIL/uL — ABNORMAL LOW (ref 4.22–5.81)
RBC: 3.59 MIL/uL — AB (ref 4.22–5.81)
RDW: 14.2 % (ref 11.5–15.5)
RDW: 14.4 % (ref 11.5–15.5)
WBC: 12 10*3/uL — ABNORMAL HIGH (ref 4.0–10.5)
WBC: 8.9 10*3/uL (ref 4.0–10.5)

## 2014-08-31 LAB — CBC WITH DIFFERENTIAL/PLATELET
Basophils Absolute: 0 10*3/uL (ref 0.0–0.1)
Basophils Relative: 0 % (ref 0–1)
EOS PCT: 0 % (ref 0–5)
Eosinophils Absolute: 0 10*3/uL (ref 0.0–0.7)
HEMATOCRIT: 32 % — AB (ref 39.0–52.0)
Hemoglobin: 10.8 g/dL — ABNORMAL LOW (ref 13.0–17.0)
LYMPHS ABS: 1.5 10*3/uL (ref 0.7–4.0)
LYMPHS PCT: 10 % — AB (ref 12–46)
MCH: 31.2 pg (ref 26.0–34.0)
MCHC: 33.8 g/dL (ref 30.0–36.0)
MCV: 92.5 fL (ref 78.0–100.0)
MONO ABS: 1.2 10*3/uL — AB (ref 0.1–1.0)
MONOS PCT: 9 % (ref 3–12)
Neutro Abs: 11.4 10*3/uL — ABNORMAL HIGH (ref 1.7–7.7)
Neutrophils Relative %: 81 % — ABNORMAL HIGH (ref 43–77)
Platelets: 252 10*3/uL (ref 150–400)
RBC: 3.46 MIL/uL — ABNORMAL LOW (ref 4.22–5.81)
RDW: 13.9 % (ref 11.5–15.5)
WBC: 14.1 10*3/uL — AB (ref 4.0–10.5)

## 2014-08-31 LAB — BASIC METABOLIC PANEL
Anion gap: 5 (ref 5–15)
BUN: 11 mg/dL (ref 6–20)
CHLORIDE: 112 mmol/L — AB (ref 101–111)
CO2: 20 mmol/L — ABNORMAL LOW (ref 22–32)
CREATININE: 0.6 mg/dL — AB (ref 0.61–1.24)
Calcium: 8.1 mg/dL — ABNORMAL LOW (ref 8.9–10.3)
GFR calc non Af Amer: >60 mL/min (ref >60)
GLUCOSE: 169 mg/dL — AB (ref 70–99)
POTASSIUM: 3.7 mmol/L (ref 3.5–5.1)
Sodium: 137 mmol/L (ref 135–145)

## 2014-08-31 LAB — MAGNESIUM: Magnesium: 2 mg/dL (ref 1.7–2.4)

## 2014-08-31 LAB — TROPONIN I
TROPONIN I: 0.81 ng/mL — AB (ref <0.031)
Troponin I: 0.65 ng/mL (ref <0.031)
Troponin I: 0.42 ng/mL — ABNORMAL HIGH (ref <0.031)

## 2014-08-31 LAB — T4, FREE: Free T4: 0.98 ng/dL (ref 0.61–1.12)

## 2014-08-31 LAB — MRSA PCR SCREENING: MRSA BY PCR: NEGATIVE

## 2014-08-31 LAB — PREPARE RBC (CROSSMATCH)

## 2014-08-31 LAB — TSH: TSH: 0.449 u[IU]/mL (ref 0.350–4.500)

## 2014-08-31 MED ORDER — LACTATED RINGERS IV BOLUS (SEPSIS)
500.0000 mL | Freq: Once | INTRAVENOUS | Status: DC
Start: 2014-08-31 — End: 2014-08-31

## 2014-08-31 MED ORDER — METOPROLOL TARTRATE 12.5 MG HALF TABLET
12.5000 mg | ORAL_TABLET | Freq: Two times a day (BID) | ORAL | Status: DC
  Administered 2014-08-31: 12.5 mg via ORAL
  Filled 2014-08-31 (×3): qty 1

## 2014-08-31 MED ORDER — SODIUM CHLORIDE 0.9 % IV BOLUS (SEPSIS)
500.0000 mL | Freq: Once | INTRAVENOUS | Status: AC
  Administered 2014-08-31: 500 mL via INTRAVENOUS

## 2014-08-31 MED ORDER — SODIUM CHLORIDE 0.9 % IV SOLN
INTRAVENOUS | Status: DC
  Administered 2014-08-31: 500 mL via INTRAVENOUS

## 2014-08-31 MED ORDER — SODIUM CHLORIDE 0.9 % IV SOLN
INTRAVENOUS | Status: DC
  Administered 2014-08-31: 02:00:00 via INTRAVENOUS

## 2014-08-31 MED ORDER — SODIUM CHLORIDE 0.9 % IV SOLN
Freq: Once | INTRAVENOUS | Status: DC
  Administered 2014-08-31: 01:00:00 via INTRAVENOUS

## 2014-08-31 MED ORDER — SODIUM CHLORIDE 0.9 % IV SOLN: Freq: Once | INTRAVENOUS | Status: AC

## 2014-08-31 MED ORDER — ACETAMINOPHEN 500 MG PO TABS
1000.0000 mg | ORAL_TABLET | Freq: Four times a day (QID) | ORAL | Status: DC | PRN
  Administered 2014-08-31 – 2014-09-01 (×2): 1000 mg via ORAL
  Filled 2014-08-31 (×2): qty 2

## 2014-08-31 MED ORDER — SODIUM CHLORIDE 0.9 % IV SOLN
2.0000 g | Freq: Once | INTRAVENOUS | Status: AC
  Administered 2014-08-31: 2 g via INTRAVENOUS
  Filled 2014-08-31: qty 20

## 2014-08-31 MED ORDER — SODIUM CHLORIDE 0.9 % IV SOLN
Freq: Once | INTRAVENOUS | Status: AC
  Administered 2014-08-31: 02:00:00 via INTRAVENOUS

## 2014-08-31 NOTE — Progress Notes (Signed)
Though patient initially agreed to have heart cath procedure, patient now having a change of thoughts. Patient had discussion with family in regards to the heart cath procedure. Though purpose, risk and benefits had been explained to patient by MD (interpreter used), patient does not feel that the procedure is necessary and he feels that his chest pain is not adequate enough for him to want to undergo the procedure. Patient stated, "life is life" and he does not want to have the procedure done. The procedure and its purpose explained to the patient and his family once more, patient still declines to have the procedure done. Family in support of the patient's decision.  MD made aware.   Stanton KidneyCURRY, Liese Dizdarevic R

## 2014-08-31 NOTE — Progress Notes (Signed)
eLink Physician-Brief Progress Note Patient Name: Brandon Carrillo DOB: 05/29/1945 MRN: [id number]   Date of Service  08/31/2014  HPI/Events of Note  69 yo male. Larey SeatFell earlier today sustaining Pharmacist, communityear lac. Hgb 14.3 --> 11.9. C/o chest pain --> troponin 0.48. Clinical picture c/w NSTEMI. Current on medical  Management - Nitroglycerin IV infusion and ASA. No heparin d/t ear laceration and extensive blood loss. Trending Hgb and Troponins. Management per Cardiology.   eICU Interventions  Continue current management.      Intervention Category Evaluation Type: New Patient Evaluation  Brandon Carrillo,Brandon Carrillo 08/31/2014, 12:51 AM

## 2014-08-31 NOTE — Progress Notes (Signed)
Cardiology Progress Note  Subjective: Patient hypotensive overnight (after blood loss and on NTG gtt) with improvement this AM (s/p IVF, PRBCs, off NTG). Mr. Poblete was seen and examined this morning.  He says he has a little chest pain and ear pain but that symptoms have improved.  He denies dyspnea.    Objective: Vital signs in last 24 hours: Filed Vitals:   08/31/14 0600 08/31/14 0700 08/31/14 0800 08/31/14 0900  BP: 101/52  Pulse: 62 65 69 69  Temp:      TempSrc:      Resp: Height:      Weight:      SpO2: 97% 99% 100% 98%   Weight change:   Intake/Output Summary (Last 24 hours) at 08/31/14 0713 Last data filed at 08/31/14 0600  Gross per 24 hour  Intake 3677.78 ml  Output    300 ml  Net 3377.78 ml   General: resting in bed in NAD, pleasant and cooperative HEENT: Right ear laceration s/p suture; there is some dried blood on the right ear but no bleeding currently; no JVD or carotid bruits Cardiac: RRR, no rubs, murmurs or gallops; distal pulses 2+; mild TTP left chest, there is no bruising  Pulm: CTA B/L anteriorly, moving normal volumes of air, no wheezes, rales or rhonchi Abd: soft, nontender, nondistended, BS present GU:  400cc amber urine in Foley bag Ext: warm and well perfused, no pedal edema, SCDs in place Neuro: alert and oriented X3, responding appropriately in Spanish, moving extremities spontaneously, B/L resting tremor  Lab Results: Basic Metabolic Panel:  Recent Labs Lab 08/30/14 1558 08/30/14 2353  NA 145  --   K 4.1  --   CL 105  --   GLUCOSE 111*  --   BUN 19  --   CREATININE 0.80  --   MG  --  2.0   CBC:  Recent Labs Lab 08/30/14 2353 08/31/14 0505  WBC 14.1* 12.0*  NEUTROABS 11.4*  --   HGB 10.8* 10.4*  HCT 32.0* 31.6*  MCV 92.5 93.2  PLT 252 202   Cardiac Enzymes:  Recent Labs Lab 08/30/14 2353 08/31/14 0505  TROPONINI 0.81* 0.65*   CBG:  Recent Labs Lab 08/30/14 2120  GLUCAP  127*   Fasting Lipid Panel:  Recent Labs Lab 08/31/14 0505  CHOL 112  HDL 34*  LDLCALC 74  TRIG 21  CHOLHDL 3.3   Thyroid Function Tests:  Recent Labs Lab 08/30/14 2353  TSH 0.449  FREET4 0.98   Coagulation:  Recent Labs Lab 08/30/14 1550  LABPROT 14.1  INR 1.07   Micro Results: Recent Results (from the past 240 hour(s))  MRSA PCR Screening     Status: None   Collection Time: 08/30/14 11:29 PM  Result Value Ref Range Status   MRSA by PCR NEGATIVE NEGATIVE Final    Comment:        The GeneXpert MRSA Assay (FDA approved for NASAL specimens only), is one component of a comprehensive MRSA colonization surveillance program. It is not intended to diagnose MRSA infection nor to guide or monitor treatment for MRSA infections.    Studies/Results: Dg Chest 2 View  08/30/2014   CLINICAL DATA:  Patient fell through glass table with multiple lacerations. Anterior left chest pain.  EXAM: CHEST  2 VIEW  COMPARISON:  09/04/2009  FINDINGS: Emphysematous changes in the lungs. Linear atelectasis in the lung bases. Normal heart size and pulmonary vascularity. No focal airspace  disease. No blunting of costophrenic angles. No pneumothorax. Tortuous aorta. Mediastinal contours appear intact. Degenerative changes in the spine with probable anterior compression of T12. Appearance is similar to prior study. No radiopaque soft tissue foreign bodies.  IMPRESSION: Emphysematous changes in the lungs. Fibrosis or atelectasis in the lung bases. No acute consolidation identified.   Electronically Signed   By: Burman NievesWilliam  Stevens M.D.   On: 08/30/2014 23:05   Dg Wrist Complete Right  08/30/2014   CLINICAL DATA:  Felt for a glass table at home, with multiple lacerations.  EXAM: RIGHT WRIST - COMPLETE 3+ VIEW  COMPARISON:  None.  FINDINGS: Negative for fracture, dislocation or radiopaque foreign body.  IMPRESSION: Negative.   Electronically Signed   By: Ellery Plunkaniel R Mitchell M.D.   On: 08/30/2014 19:44   Ct  Head Wo Contrast  08/30/2014   CLINICAL DATA:  Recent fall onto glass table with right ear wound, initial encounter  EXAM: CT HEAD WITHOUT CONTRAST  CT CERVICAL SPINE WITHOUT CONTRAST  TECHNIQUE: Multidetector CT imaging of the head and cervical spine was performed following the standard protocol without intravenous contrast. Multiplanar CT image reconstructions of the cervical spine were also generated.  COMPARISON:  09/04/2009  FINDINGS: CT HEAD FINDINGS  Bony calvarium is intact. No radiopaque foreign body is identified. There are soft tissue changes along the right temporal region consistent with a recent injury. Soft tissue density is noted within the external auditory canal which may be related to cerumen although could be related thrombus from the recent hemorrhage and injury. Atrophic changes are seen. No findings to suggest acute hemorrhage, acute infarction or space-occupying mass lesion are noted.  CT CERVICAL SPINE FINDINGS  Seven cervical segments are well visualized. Vertebral body height is well maintained. Disc space narrowing is noted at C6-7. Mild facet hypertrophic changes and osteophytic changes are seen. No acute fracture or acute facet abnormality is noted. Surrounding soft tissues are within normal limits.  IMPRESSION: CT of the head: Soft tissue changes consistent with the recent injury about the right ear.  Atrophic changes without acute abnormality.  CT of the cervical spine: Mild degenerative changes without acute abnormality.   Electronically Signed   By: Alcide CleverMark  Lukens M.D.   On: 08/30/2014 18:20   Ct Cervical Spine Wo Contrast  08/30/2014   CLINICAL DATA:  Recent fall onto glass table with right ear wound, initial encounter  EXAM: CT HEAD WITHOUT CONTRAST  CT CERVICAL SPINE WITHOUT CONTRAST  TECHNIQUE: Multidetector CT imaging of the head and cervical spine was performed following the standard protocol without intravenous contrast. Multiplanar CT image reconstructions of the cervical  spine were also generated.  COMPARISON:  09/04/2009  FINDINGS: CT HEAD FINDINGS  Bony calvarium is intact. No radiopaque foreign body is identified. There are soft tissue changes along the right temporal region consistent with a recent injury. Soft tissue density is noted within the external auditory canal which may be related to cerumen although could be related thrombus from the recent hemorrhage and injury. Atrophic changes are seen. No findings to suggest acute hemorrhage, acute infarction or space-occupying mass lesion are noted.  CT CERVICAL SPINE FINDINGS  Seven cervical segments are well visualized. Vertebral body height is well maintained. Disc space narrowing is noted at C6-7. Mild facet hypertrophic changes and osteophytic changes are seen. No acute fracture or acute facet abnormality is noted. Surrounding soft tissues are within normal limits.  IMPRESSION: CT of the head: Soft tissue changes consistent with the recent injury about the  right ear.  Atrophic changes without acute abnormality.  CT of the cervical spine: Mild degenerative changes without acute abnormality.   Electronically Signed   By: Alcide Clever M.D.   On: 08/30/2014 18:20   Dg Hand Complete Right  08/30/2014   CLINICAL DATA:  Larey Seat through glass table at home. Multiple lacerations.  EXAM: RIGHT HAND - COMPLETE 3+ VIEW  COMPARISON:  None.  FINDINGS: The film suffer from some motion degradation. No evidence of radiopaque foreign object or acute fracture. There is probably an old fracture of the middle phalanx of the long finger distally. 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  IMPRESSION: No acute finding by radiography. No radiopaque foreign object or acute fracture.   Electronically Signed   By: Paulina Fusi M.D.   On: 08/30/2014 19:44   08/30/14 bedside ECHO:  Grossly normal LV and RV function, no pericardial effusion, no apparent regional wall motion abnormalities  Telemetry:  NSR 70s (monitor reading SV rhythm at times but I think it is  picking up artifact from upper extremity tremor).   Medications: I have reviewed the patient's current medications. Scheduled Meds: . aspirin EC  81 mg Oral Daily  . atorvastatin  40 mg Oral q1800  . carbidopa-levodopa  1 tablet Oral QHS  . carbidopa-levodopa  2 tablet Oral TID WC  . metoprolol tartrate  25 mg Oral BID   Continuous Infusions: . sodium chloride 999 mL/hr at 08/31/14 0155  . sodium chloride 10 mL/hr at 08/31/14 0200  . nitroGLYCERIN Stopped (08/30/14 2330)   PRN Meds:.acetaminophen, nitroGLYCERIN, ondansetron (ZOFRAN) IV   Assessment/Plan: NSTEMI:  ACS vs demand ischemia due to acute blood loss.  CP and diaphoretic in the ED with troponin 0.05 --> 0.48 --> 0.81 --> 0.65.  Initial EKG uninterrpretable 2/2 artifact from patient's PD tremor.  Subsequent EKG reveals NSR w/o ST changes.  Heparin gtt was not started 2/2 to significant blood loss from ear laceration.  He received ASA  and NTG gtt was started.  - continue ASA  daily, metoprolol  BID, Lipitor  daily (all started this admission) - will wait to start ACEI until BPs better - awaiting 2D ECHO today - will wait until tomorrow for catheterization because the patient has just had significant blood loss and will require A/C for cath - NPO past MN 09/01/14  HTN:  BPs soft due to blood loss and NTG gtt.  HR stable.  He is s/p 2.5L IVF and 1 unit PRBCs.  NTG gtt has been stopped with improvement in pressures.  - monitor BP - ok to continue BB - check BMP - encourage po   Acute blood loss anemia 2/2 to right scalp/ear laceration:  Patient experienced mechanical fall with subsequent ear laceration.  ENT estimated blood loss at 1-2 qrts.  Hgb initially normal at 14.3 in ED but then dropped to 10.8. Ear laceration was repaired by ENT.  Patient s/p 1 unit PRBCs.  Hgb stable at 10.4 this AM.  Bleeding has resolved. Appreciate ENT intervention and recommendations. - recheck Hgb this PM and tomorrow AM -  transfuse for Hgb < 8 - ice, elevation, analgesia, wound hygiene, follow-up with ENT as outpatient in 8-10 days for suture removal  Parkinson's Disease:  Continue home Sinemet.   LOS: 1 day   Yolanda Manges, DO IMTS, PGY2 08/31/2014, 7:13 AM    Attending Note:   The patient was seen and examined.  Agree with assessment and plan as noted above.  Changes made to the  above note as needed.  I spent about 40 minutes with patient, including time with intrepreter, chart review, discussion with Dr. Andrey CampanileWilson. He initially agreed with plans for cath but now I am imformed that he has been talking to family and does not think that he wants to have the cath. The nurse will talk further with the family .  He denies any CP.  Does not have significant risk factors.  Will leave it up to him whether or not he has the cath. He has severe tremors due to parkinsons.  A myoview would not be interpretable.    Vesta MixerPhilip J. Nahser, Montez HagemanJr., MD, [facility]tonFACC 08/31/2014, 12:57 PM [address],  [address] Office [phone] Pager [phone]

## 2014-08-31 NOTE — Progress Notes (Signed)
Patient declined to have heart echo completed; Telephone interpreter used. Per interpreter patient asked "how much is this?" and that "he feels fine".  Patient does not want procedure completed. Will place case management consult to for financial assistance resources incase it is needed.   Rise PaganiniURRY, Damek Ende R, RN

## 2014-09-01 ENCOUNTER — Encounter (HOSPITAL_COMMUNITY)
Admission: EM | Disposition: A | Discharge status: Home / Self Care | Payer: Self-pay | Source: Home / Self Care | Attending: Cardiovascular Disease

## 2014-09-01 ENCOUNTER — Inpatient Hospital Stay (HOSPITAL_COMMUNITY): Payer: Medicaid Other

## 2014-09-01 ENCOUNTER — Inpatient Hospital Stay (HOSPITAL_COMMUNITY): Payer: Self-pay

## 2014-09-01 DIAGNOSIS — D62 Acute posthemorrhagic anemia: Secondary | ICD-10-CM

## 2014-09-01 LAB — CBC
HCT: 34.3 % — ABNORMAL LOW (ref 39.0–52.0)
Hemoglobin: 11.3 g/dL — ABNORMAL LOW (ref 13.0–17.0)
MCH: 31.3 pg (ref 26.0–34.0)
MCHC: 32.9 g/dL (ref 30.0–36.0)
MCV: 95 fL (ref 78.0–100.0)
PLATELETS: 201 10*3/uL (ref 150–400)
RBC: 3.61 MIL/uL — ABNORMAL LOW (ref 4.22–5.81)
RDW: 14.6 % (ref 11.5–15.5)
WBC: 9.9 10*3/uL (ref 4.0–10.5)

## 2014-09-01 LAB — HEMOGLOBIN A1C
HEMOGLOBIN A1C: 6.8 % — AB (ref 4.8–5.6)
MEAN PLASMA GLUCOSE: 148 mg/dL

## 2014-09-01 LAB — BASIC METABOLIC PANEL
ANION GAP: 6 (ref 5–15)
BUN: 10 mg/dL (ref 6–20)
CALCIUM: 8.2 mg/dL — AB (ref 8.9–10.3)
CO2: 22 mmol/L (ref 22–32)
CREATININE: 0.67 mg/dL (ref 0.61–1.24)
Chloride: 112 mmol/L — ABNORMAL HIGH (ref 101–111)
Glucose, Bld: 89 mg/dL (ref 70–99)
Potassium: 4.4 mmol/L (ref 3.5–5.1)
Sodium: 140 mmol/L (ref 135–145)

## 2014-09-01 SURGERY — LEFT HEART CATH AND CORONARY ANGIOGRAPHY

## 2014-09-01 MED ORDER — ASPIRIN 81 MG PO TBEC: 81.0000 mg | DELAYED_RELEASE_TABLET | Freq: Every day | ORAL | Status: DC

## 2014-09-01 MED ORDER — CARVEDILOL 3.125 MG PO TABS: 3.1250 mg | ORAL_TABLET | Freq: Two times a day (BID) | ORAL | Status: DC

## 2014-09-01 MED ORDER — LOSARTAN POTASSIUM 25 MG PO TABS: 25.0000 mg | ORAL_TABLET | Freq: Every day | ORAL | Status: DC

## 2014-09-01 MED ORDER — ATORVASTATIN CALCIUM 40 MG PO TABS: 40.0000 mg | ORAL_TABLET | Freq: Every day | ORAL | Status: DC

## 2014-09-01 MED ORDER — LOSARTAN POTASSIUM 25 MG PO TABS
25.0000 mg | ORAL_TABLET | Freq: Every day | ORAL | Status: DC
  Administered 2014-09-01: 25 mg via ORAL
  Filled 2014-09-01: qty 1

## 2014-09-01 MED ORDER — CARVEDILOL 3.125 MG PO TABS
3.1250 mg | ORAL_TABLET | Freq: Two times a day (BID) | ORAL | Status: DC
  Administered 2014-09-01 (×2): 3.125 mg via ORAL
  Filled 2014-09-01 (×3): qty 1

## 2014-09-01 NOTE — Evaluation (Signed)
Physical Therapy Evaluation Patient Details Name: Chong Sicilianedro Carreno-Vazquez MRN: [id number] DOB: 12/08/1945 Today's Date: 09/01/2014   History of Present Illness  This is 69 year old Hispanic male with past medical history of parkinsonism and possible past medical history of hypertension who was brought in by EMS to the emergency department after he sustained a mechanical fall and fell over a glass bowl on his porch. Patient has severe laceration of his right ureter as well as right forearm. NSTEMI  Clinical Impression  Pt pleasant and willing to walk and work on mobility but demonstrates decreased ability to follow commands even with interpreter present to translate. Pt with unsteady gait, at least 3 falls in the last year, no assist at home, stairs to enter camper, decreased balance and mobility. Pt with above and below deficits who will benefit from acute therapy to maximize mobility, function, gait and balance to increase independence and decrease fall risk.     Follow Up Recommendations SNF;Supervision for mobility/OOB    Equipment Recommendations  Rolling walker with 5" wheels    Recommendations for Other Services OT consult     Precautions / Restrictions Precautions Precautions: Fall      Mobility  Bed Mobility               General bed mobility comments: in recliner on arrival  Transfers Overall transfer level: Needs assistance   Transfers: Sit to/from Stand Sit to Stand: Min guard         General transfer comment: cues for hand placement, safety and sequence  Ambulation/Gait Ambulation/Gait assistance: Min assist Ambulation Distance (Feet): 150 Feet Assistive device: Rolling walker (2 wheeled) Gait Pattern/deviations: Shuffle;Trunk flexed;Narrow base of support   Gait velocity interpretation: Below normal speed for age/gender General Gait Details: pt with very shuffled unsteady gait without use of RW 25', with RW increased stride but remained shuffled with  cues for RW use, position in RW, posture, looking up and directional cues  Stairs            Wheelchair Mobility    Modified Rankin (Stroke Patients Only)       Balance Overall balance assessment: Needs assistance;History of Falls   Sitting balance-Leahy Scale: Good       Standing balance-Leahy Scale: Poor                               Pertinent Vitals/Pain Pain Assessment: No/denies pain  HR 62-73    Home Living Family/patient expects to be discharged to:: Private residence Living Arrangements: Children Available Help at Discharge: Available PRN/intermittently Type of Home: Other(Comment) Home Access: Stairs to enter   Entrance Stairs-Number of Steps: 4 Home Layout: One level Home Equipment: Cane - single point Additional Comments: pt lives in camper on son's property. Family drops off meals, he has a water hose run out to the camper and some sort of power    Prior Function Level of Independence: Needs assistance   Gait / Transfers Assistance Needed: pt states he can walk around on his own. Only moves around in his camper or to the porch of son's house. Not allowed in the house per pt  ADL's / Homemaking Assistance Needed: a couple from the church drops off gallons of drinking of water during the week and does Devun's laundry        Hand Dominance        Extremity/Trunk Assessment   Upper Extremity Assessment: Generalized weakness  LUE Deficits / Details: tremor   Lower Extremity Assessment: Generalized weakness      Cervical / Trunk Assessment: Normal  Communication   Communication: Interpreter utilized;Prefers language other than English Garment/textile technologist(Interpreter [place] utilized throughout)  Cognition Arousal/Alertness: Awake/alert Behavior During Therapy: WFL for tasks assessed/performed Overall Cognitive Status: Impaired/Different from baseline Area of Impairment: Safety/judgement         Safety/Judgement: Decreased awareness  of safety;Decreased awareness of deficits     General Comments: pt with delayed response to commands for safety and RW use throughout session    General Comments      Exercises        Assessment/Plan    PT Assessment Patient needs continued PT services  PT Diagnosis Abnormality of gait;Generalized weakness;Altered mental status   PT Problem List Decreased strength;Decreased cognition;Decreased activity tolerance;Decreased balance;Decreased safety awareness;Decreased knowledge of use of DME;Decreased mobility  PT Treatment Interventions Gait training;Stair training;Functional mobility training;Therapeutic activities;Therapeutic exercise;Balance training;Patient/family education;DME instruction;Cognitive remediation   PT Goals (Current goals can be found in the Care Plan section) Acute Rehab PT Goals Patient Stated Goal: be able to walk PT Goal Formulation: With patient Time For Goal Achievement: 09/15/14 Potential to Achieve Goals: Fair    Frequency Min 3X/week   Barriers to discharge Decreased caregiver support      Co-evaluation               End of Session Equipment Utilized During Treatment: Gait belt Activity Tolerance: Patient tolerated treatment well Patient left: in chair;with call bell/phone within reach;with family/visitor present Nurse Communication: Mobility status;Precautions         Time: [id number] PT Time Calculation (min) (ACUTE ONLY): 37 min   Charges:   PT Evaluation $Initial PT Evaluation Tier I: 1 Procedure PT Treatments $Gait Training: 8-22 mins   PT G CodesDelorse Lek:        Tabor, Brandin Dilday Beth 09/01/2014, 3:00 PM Delaney MeigsMaija Tabor Deacon Gadbois, PT [phone]

## 2014-09-01 NOTE — Progress Notes (Signed)
[facility] notified RW is needed for home. Spoke to rep and they will have RW shipped to home. Verified address is correct on facesheet. Spoke to son, Rulon EisenmengerFelix and provided [facility] to follow up on [facility]. [facility] and to follow up at clinic pharmacy to pick up pt's meds. Isidoro DonningAlesia Shatisha Falter RN CCM Case Mgmt

## 2014-09-01 NOTE — Progress Notes (Signed)
Interpreter Wyvonnia DuskyGraciela Namihira for [facility] RN Care Managment
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
## 2014-09-01 NOTE — Discharge Instructions (Signed)
1. You have hospital follow up appointments as follows: Lunes, 16 de mayo, 2016 at 1:50PM  Dr. Bland SpanKarol Woliki  [facility] [address] [place] [address] [phone]  [place], [id number] at [place] Dr. Doris Cheadleeepak Advani [facility] [address] [place] [address] [phone]  2. Por favor, tome todos los medicamentos prescritos.  Los nuevos medicamentos son carvedilol, losartan, atorvastatin and aspirin (baby aspirin, 81mg ).     3. Si ha empeoramiento de sus sntomas o si surgen nuevos sntomas, por favor llame a la clnica [phone] o llame al 911 si los sntomas son graves.   Prevencin de las cadas y seguridad en Advice workerel hogar  (Fall Prevention and Financial risk analystHome Safety)  Las cadas causan lesiones y Futures traderpueden afectar a personas de todas las edades. Es posible prevenir las cadas.  CMO PREVENIR LAS CADAS   Use zapatos con suela de goma que no tengan abertura para los dedos del pie.  Mantenga el interior y Dance movement psychotherapistel exterior de su casa bien iluminada.  Utilice luces de noche en su hogar.  No acumule desorden en los pisos.  Limpie los derrames del suelo.  Retire las alfombras o fjelas al suelo con cinta adhesiva para alfombras.  No  colocar cables elctricos en los pasillos.  Coloque agarraderas en la baera, la ducha y el inodoro. No utilizar toalleros como barras de agarre.  Coloque barandas a ambos lados de Dispensing opticianla escalera. Arregle las barandas sueltas.  No se suba a bancos o escaleras de tijera, en lo posible.  No encere los pisos.  Repare aceras, calzadas o escaleras irregulares o inseguras.  Mantenga los artculos que [place]util[place] habitualmente dentro de su alcance.  Tenga cuidado con las [employer] [place].  Tenga los nmeros de emergencia cerca del telfono.  Coloque detectores de humo en su casa y cerca de los dormitorios. Pregntele a su mdico qu otras cosas puede hacer para prevenir las cadas.  Document Released:  10/10/2011 [facility] Patient Information 2015 [place], [place]. This information is not intended to replace advice given to you by your health care provider. Make sure you discuss any questions you have with your health care provider.

## 2014-09-01 NOTE — Progress Notes (Signed)
Cardiology Progress Note  Subjective: No acute events overnight.  Brandon Carrillo was seen and examined this morning.   He feels well and denies CP or dyspnea.  He requests that his SCDs be removed.   Objective: Vital signs in last 24 hours: Filed Vitals:   09/01/14 0200 09/01/14 0300 09/01/14 0400 09/01/14 0500  BP: 102/56 119/52 128/59 125/66  Pulse: 55 57 63 65  Temp:   98.3 F (36.8 C)   TempSrc:   Oral   Resp: 15 15 21 17   Height:    5\' 6"  (1.676 m)  Weight:    161 lb 2.5 oz (73.1 kg)  SpO2: 98% 98% 100% 97%   Weight change: 5 lb 1.1 oz (2.3 kg)  Intake/Output Summary (Last 24 hours) at 09/01/14 0658 Last data filed at 09/01/14 0545  Gross per 24 hour  Intake   1700 ml  Output   3000 ml  Net  -1300 ml   General: resting in bed in NAD, pleasant and cooperative HEENT: Right ear laceration s/p suture; no JVD or carotid bruits Cardiac: RRR, no rubs, murmurs or gallops; distal pulses 2+ Pulm: CTA B/L, moving normal volumes of air, no wheezes, rales or rhonchi Abd: soft, nontender, nondistended, BS present GU:  100cc amber urine in Foley bag (RN reports the bag was recently emptied) Ext: warm and well perfused, no pedal edema, SCDs in place Neuro: alert and oriented X3, responding appropriately in Spanish, moving extremities spontaneously and on command, B/L upper extremity resting tremor  Lab Results: Basic Metabolic Panel:  Recent Labs Lab 08/30/14 2353 08/31/14 1130 09/01/14 0247  NA  --  137 140  K  --  3.7 4.4  CL  --  112* 112*  CO2  --  20* 22  GLUCOSE  --  169* 89  BUN  --  11 10  CREATININE  --  0.60* 0.67  CALCIUM  --  8.1* 8.2*  MG 2.0  --   --    CBC:  Recent Labs Lab 08/30/14 2353  08/31/14 1806 09/01/14 0247  WBC 14.1*  < > 8.9 9.9  NEUTROABS 11.4*  --   --   --   HGB 10.8*  < > 11.0* 11.3*  HCT 32.0*  < > 33.4* 34.3*  MCV 92.5  < > 93.0 95.0  PLT 252  < > 210 201  < > = values in this interval not displayed. Cardiac  Enzymes:  Recent Labs Lab 08/30/14 2353 08/31/14 0505 08/31/14 1130  TROPONINI 0.81* 0.65* 0.42*   CBG:  Recent Labs Lab 08/30/14 2120  GLUCAP 127*   Fasting Lipid Panel:  Recent Labs Lab 08/31/14 0505  CHOL 112  HDL 34*  LDLCALC 74  TRIG 21  CHOLHDL 3.3   Thyroid Function Tests:  Recent Labs Lab 08/30/14 2353  TSH 0.449  FREET4 0.98   Coagulation:  Recent Labs Lab 08/30/14 1550  LABPROT 14.1  INR 1.07    08/30/14 bedside ECHO:  Grossly normal LV and RV function, no pericardial effusion, no apparent regional wall motion abnormalities.  Telemetry:  NSR 60s-70s, the monitor reads non-sustained VT at times but this seems to be artifact 2/2 to significant upper extremity tremor   Medications: I have reviewed the patient's current medications. Scheduled Meds: . aspirin EC  81 mg Oral Daily  . atorvastatin  40 mg Oral q1800  . carbidopa-levodopa  1 tablet Oral QHS  . carbidopa-levodopa  2 tablet Oral TID WC  . metoprolol tartrate  12.5 mg Oral BID   Continuous Infusions: . sodium chloride 999 mL/hr at 08/31/14 0155  . sodium chloride 10 mL/hr at 08/31/14 0200  . sodium chloride 500 mL (08/31/14 1900)  . nitroGLYCERIN Stopped (08/30/14 2330)   PRN Meds:.acetaminophen, nitroGLYCERIN, ondansetron (ZOFRAN) IV   Assessment/Plan: NSTEMI:  ACS vs demand ischemia due to acute blood loss.  Troponin peaked at 0.81 and have declined to 0.42 most recently.  The patient denies CP currently.  Given the significant bump in troponin CAD is a consideration.  We have attempted to investigate for any lesions amenable to treatment however the patient declined 2D ECHO yesterday and he has changed his mind and decided not to have catheterization.  The reasons for the procedure were explained to him with the assistance of translator but he still declines.   - continue ASA 81mg  daily, change metoprolol 12.5mg  BID to Coreg 3.125mg  (given DM, GEMINI trial), Lipitor 40mg  daily  -  START losartan 25mg  daily (for cardiac benefit and given likely DM dx - A1c 6.8 this admission) - consult to case manager to assist with medications ([facility] uninsured but [facility]) - resume heart healthy diet since he will not be going to cath  - ambulate with PT and RN - plan for d/c home this afternoon if he continues to do well - will plan for close follow-up with PCP and cardiology; perhaps he will consider cath or additional testing at a later date  HTN:  BPs normal this AM.   - monitor BP - continue BB at d/c - will start low dose ACEI given likely DM and for above - patient to follow-up with PCP  Presumed DM2:  The patient's A1c is 6.8.  Prior A1c 2 months ago was 6.2 (prediabetic range).  Ideally, this measurement should be repeated on a second separate occasion to confirm the diagnosis.  He is asymptomatic and blood glucose by BMP has been between 89 and 169 this admission.  No indication for urgent treatment.  - will plan for close follow-up with PCP to have A1c rechecked and initiate treatment if appropriate  Acute blood loss anemia 2/2 to right scalp/ear laceration:  Repaired by ENT.  Bleeding has resolved and hgb stable at 11.3.  Appreciate ENT intervention and recommendations. - ice, elevation, analgesia, wound hygiene - will arrange follow-up with ENT as outpatient in 8-10 days for suture removal  Parkinson's Disease:  Significant upper extremity tremor makes telemetry and EKG interpretation difficult and precludes the use of myoview.  Continue home Sinemet.  VTE ppx:  Patient has been wearing SCDs only because pharmacologic VTE was contraindicated in the setting of significant blood loss.  This morning while I was examining him he asked that they be removed. - SCDs removed - RN asked to ambulate patient - PT/OT ordered   LOS: 2 days   Yolanda MangesAlex M Wilson, DO IMTS, PGY2 09/01/2014, 6:58 AM     Attending Note:   The patient was seen and  examined.  Agree with assessment and plan as noted above.  Changes made to the above note as needed.  Pt is refusing his echo. Has refused cath. He will need to follow up with his general medical doctor    Vesta MixerPhilip J. Ahnika Hannibal, Montez HagemanJr., MD, [facility] 09/01/2014, 2:59 PM [address],  [address] Office [phone] Pager [phone]
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
## 2014-09-01 NOTE — Progress Notes (Addendum)
Patient's tremors increased and patient appeared very uncomfortable in bed.  Utilized Education officer, environmentaltranslator service to discuss same with patient.  Patient stated he did not receive his Parkinson medicine last evening.  I reviewed MAR and PTA medication list and discussed with patient that I administered his evening dose of carbidopa-levodopa, and [facility] received his other three daily doses as well.  Asked patient if he took any other Parkinson medications at home as patient stating his medication has worn off and he needs more as his arms are hurting, patient was not able to advise if he took additional medications.  I offered patient PRN Tylenol to see if this will assist in reducing arm pain and advised he will be getting his morning dose of carbidopa-levodopa with breakfast, patient agreed to same.  [id number]: reassessed patient status, patient currently resting comfortably and stated he is doing ok now.  Ivery QualeJackson, Ludie Pavlik A, RN

## 2014-09-01 NOTE — Care Management Note (Signed)
Case Management Note  Patient Details  Name: Brandon Carrillo MRN: [id number] Date of Birth: 10/29/1945  Subjective/Objective:                    Action/Plan: Lives at home with son, Brandon EisenmengerFelix [phone]  Expected Discharge Date:                  Expected Discharge Plan:  Home w Home Health Services  In-House Referral:     Discharge planning Services  CM Consult  Post Acute Care Choice:    Choice offered to:     DME Arranged:    DME Agency:     HH Arranged:    HH Agency:     Status of Service:  In process, will continue to follow  Medicare Important Message Given:  No Date Medicare IM Given:    Medicare IM give by:    Date Additional Medicare IM Given:    Additional Medicare Important Message give by:     If discussed at Long Length of Stay Meetings, dates discussed:    Additional Comments: NCM spoke to pt and he states he does not speak [place]. Contacted Spanish Interpreter to communicate with pt. Pt states he lives with son, Brandon EisenmengerFelix. He has church member that assist sometimes with his medications. He does not work. Has a cane at home. Waiting PT/OT recommendations for home. [facility] and follow up at clinic to pick up meds.  Elliot CousinShavis, Marisa Hufstetler Ellen, RN 09/01/2014, 11:23 AM

## 2014-09-01 NOTE — Progress Notes (Signed)
[facility] and losartan, coreg, and atorvastatin are free for pt at clinic. [facility]. CSW referral for SNF placement. If pt goes to SNF, [facility]. Waiting final recommendation for home. Isidoro DonningAlesia Columbus Ice RN CCM Case Mgmt phone [phone]

## 2014-09-02 LAB — TYPE AND SCREEN
ABO/RH(D): O POS
Antibody Screen: NEGATIVE
UNIT DIVISION: 0
Unit division: 0

## 2014-09-02 NOTE — Progress Notes (Signed)
Discharge instructions, medications, and follow-up appointments reviewed with Rulon EisenmengerFelix, pt's son and caregiver. Rulon EisenmengerFelix verbalized understanding. Denies any questions or concerns.

## 2014-09-08 ENCOUNTER — Inpatient Hospital Stay: Payer: Self-pay | Admitting: Internal Medicine

## 2014-09-15 ENCOUNTER — Encounter: Payer: Self-pay | Admitting: Internal Medicine

## 2014-09-15 ENCOUNTER — Ambulatory Visit: Payer: Self-pay | Attending: Internal Medicine | Admitting: Internal Medicine

## 2014-09-15 VITALS — BP 130/98 | HR 75 | Temp 98.0°F | Resp 15

## 2014-09-15 DIAGNOSIS — Z87891 Personal history of nicotine dependence: Secondary | ICD-10-CM | POA: Insufficient documentation

## 2014-09-15 DIAGNOSIS — E119 Type 2 diabetes mellitus without complications: Secondary | ICD-10-CM | POA: Insufficient documentation

## 2014-09-15 DIAGNOSIS — D62 Acute posthemorrhagic anemia: Secondary | ICD-10-CM | POA: Insufficient documentation

## 2014-09-15 DIAGNOSIS — I214 Non-ST elevation (NSTEMI) myocardial infarction: Secondary | ICD-10-CM | POA: Insufficient documentation

## 2014-09-15 DIAGNOSIS — S01311S Laceration without foreign body of right ear, sequela: Secondary | ICD-10-CM | POA: Insufficient documentation

## 2014-09-15 DIAGNOSIS — G20 Parkinson's disease: Secondary | ICD-10-CM | POA: Insufficient documentation

## 2014-09-15 DIAGNOSIS — S01311D Laceration without foreign body of right ear, subsequent encounter: Secondary | ICD-10-CM

## 2014-09-15 LAB — CBC WITH DIFFERENTIAL/PLATELET
Basophils Absolute: 0 10*3/uL (ref 0.0–0.1)
Basophils Relative: 0 % (ref 0–1)
EOS PCT: 1 % (ref 0–5)
Eosinophils Absolute: 0.1 10*3/uL (ref 0.0–0.7)
HCT: 38.4 % — ABNORMAL LOW (ref 39.0–52.0)
Hemoglobin: 12.7 g/dL — ABNORMAL LOW (ref 13.0–17.0)
Lymphocytes Relative: 15 % (ref 12–46)
Lymphs Abs: 1.5 10*3/uL (ref 0.7–4.0)
MCH: 30.6 pg (ref 26.0–34.0)
MCHC: 33.1 g/dL (ref 30.0–36.0)
MCV: 92.5 fL (ref 78.0–100.0)
MPV: 8.9 fL (ref 8.6–12.4)
Monocytes Absolute: 1.2 10*3/uL — ABNORMAL HIGH (ref 0.1–1.0)
Monocytes Relative: 12 % (ref 3–12)
Neutro Abs: 7.3 10*3/uL (ref 1.7–7.7)
Neutrophils Relative %: 72 % (ref 43–77)
PLATELETS: 408 10*3/uL — AB (ref 150–400)
RBC: 4.15 MIL/uL — ABNORMAL LOW (ref 4.22–5.81)
RDW: 14.2 % (ref 11.5–15.5)
WBC: 10.2 10*3/uL (ref 4.0–10.5)

## 2014-09-15 LAB — COMPLETE METABOLIC PANEL WITH GFR
ALBUMIN: 3.8 g/dL (ref 3.5–5.2)
ALT: 9 U/L (ref 0–53)
AST: 15 U/L (ref 0–37)
Alkaline Phosphatase: 67 U/L (ref 39–117)
BUN: 16 mg/dL (ref 6–23)
CO2: 25 mEq/L (ref 19–32)
Calcium: 9 mg/dL (ref 8.4–10.5)
Chloride: 104 mEq/L (ref 96–112)
Creat: 0.67 mg/dL (ref 0.50–1.35)
GFR, Est African American: >89 mL/min
Glucose, Bld: 129 mg/dL — ABNORMAL HIGH (ref 70–99)
POTASSIUM: 4 meq/L (ref 3.5–5.3)
SODIUM: 138 meq/L (ref 135–145)
Total Bilirubin: 0.4 mg/dL (ref 0.2–1.2)
Total Protein: 7.5 g/dL (ref 6.0–8.3)

## 2014-09-15 MED ORDER — CARBIDOPA-LEVODOPA ER 50-200 MG PO TBCR: 1.0000 | EXTENDED_RELEASE_TABLET | Freq: Every day | ORAL | Status: DC

## 2014-09-15 MED ORDER — METFORMIN HCL 500 MG PO TABS: 500.0000 mg | ORAL_TABLET | Freq: Every day | ORAL | Status: DC

## 2014-09-15 NOTE — Progress Notes (Signed)
[facility] a [facility] today at 3pm to  Remove his sutures from his right ear Patient was instructed to call office if he can not keep appt and will have [facility] For removal of the sutures
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
## 2014-09-15 NOTE — Patient Instructions (Signed)
La diabetes mellitus y los alimentos (Diabetes Mellitus and Food) Es importante que controle su nivel de azcar en la sangre (glucosa). El nivel de glucosa en sangre depende en gran medida de lo que usted come. Comer alimentos saludables en las cantidades adecuadas a lo largo del da, aproximadamente a la misma hora todos los das, lo ayudar a controlar su nivel de glucosa en sangre. Tambin puede ayudarlo a retrasar o evitar el empeoramiento de la diabetes mellitus. Comer de manera saludable incluso puede ayudarlo a mejorar el nivel de presin arterial y a alcanzar o mantener un peso saludable.  CMO PUEDEN AFECTARME LOS ALIMENTOS? Carbohidratos Los carbohidratos afectan el nivel de glucosa en sangre ms que cualquier otro tipo de alimento. El nutricionista lo ayudar a determinar cuntos carbohidratos puede consumir en cada comida y ensearle a contarlos. El recuento de carbohidratos es importante para mantener la glucosa en sangre en un nivel saludable, en especial si utiliza insulina o toma determinados medicamentos para la diabetes mellitus. Alcohol El alcohol puede provocar disminuciones sbitas de la glucosa en sangre (hipoglucemia), en especial si utiliza insulina o toma determinados medicamentos para la diabetes mellitus. La hipoglucemia es una afeccin que puede poner en peligro la vida. Los sntomas de la hipoglucemia (somnolencia, mareos y desorientacin) son similares a los sntomas de haber consumido mucho alcohol.  Si el mdico lo autoriza a beber alcohol, hgalo con moderacin y siga estas pautas:  Las mujeres no deben beber ms de un trago por da, y los hombres no deben beber ms de dos tragos por da. Un trago es igual a:  12 onzas (355 ml) de cerveza  5 onzas de vino (150 ml) de vino  1,5onzas (45ml) de bebidas espirituosas  No beba con el estmago vaco.  Mantngase hidratado. Beba agua, gaseosas dietticas o t helado sin azcar.  Las gaseosas comunes, los jugos y  otros refrescos podran contener muchos carbohidratos y se deben contar. QU ALIMENTOS NO SE RECOMIENDAN? Cuando haga las elecciones de alimentos, es importante que recuerde que todos los alimentos son distintos. Algunos tienen menos nutrientes que otros por porcin, aunque podran tener la misma cantidad de caloras o carbohidratos. Es difcil darle al cuerpo lo que necesita cuando consume alimentos con menos nutrientes. Estos son algunos ejemplos de alimentos que debera evitar ya que contienen muchas caloras y carbohidratos, pero pocos nutrientes:  Grasas trans (la mayora de los alimentos procesados incluyen grasas trans en la etiqueta de Informacin nutricional).  Gaseosas comunes.  Jugos.  Caramelos.  Dulces, como tortas, pasteles, rosquillas y galletas.  Comidas fritas. QU ALIMENTOS PUEDO COMER? Consuma alimentos ricos en nutrientes, que nutrirn el cuerpo y lo mantendrn saludable. Los alimentos que debe comer tambin dependern de varios factores, como:  Las caloras que necesita.  Los medicamentos que toma.  Su peso.  El nivel de glucosa en sangre.  El nivel de presin arterial.  El nivel de colesterol. Tambin debe consumir una variedad de alimentos, como:  Protenas, como carne, aves, pescado, tofu, frutos secos y semillas (las protenas de animales magros son mejores).  Frutas.  Verduras.  Productos lcteos, como leche, queso y yogur (descremados son mejores).  Panes, granos, pastas, cereales, arroz y frijoles.  Grasas, como aceite de oliva, margarina sin grasas trans, aceite de canola, aguacate y aceitunas. TODOS LOS QUE PADECEN DIABETES MELLITUS TIENEN EL MISMO PLAN DE COMIDAS? Dado que todas las personas que padecen diabetes mellitus son distintas, no hay un solo plan de comidas que funcione para todos. Es muy   importante que se rena con un nutricionista que lo ayudar a crear un plan de comidas adecuado para usted. Document Released: 07/18/2007  Document Revised: 04/15/2013 [employer] Patient Information 2015 [employer]. This information is not intended to replace advice given to you by your health care provider. Make sure you discuss any questions you have with your health care provider.  

## 2014-09-16 ENCOUNTER — Telehealth: Payer: Self-pay

## 2014-09-16 NOTE — Telephone Encounter (Signed)
-----   Message from Doris Cheadleeepak Advani, MD sent at 09/16/2014 10:19 AM EDT ----- Call and let the patient know that his hemoglobin is stable has not dropped.a Also  his blood sugar is elevated, advise patient to start taking metformin which was prescribed yesterday.

## 2014-09-16 NOTE — Telephone Encounter (Signed)
Home number is not correct Number listed in chart-person who answered stated patient does Not live there-it is his friend He will relay the message to return our call when he see him

## 2014-10-30 ENCOUNTER — Ambulatory Visit: Payer: Self-pay

## 2015-01-18 ENCOUNTER — Telehealth: Payer: Self-pay | Admitting: Internal Medicine

## 2015-01-18 ENCOUNTER — Ambulatory Visit: Payer: Medicaid Other | Attending: Family Medicine

## 2015-01-18 NOTE — Telephone Encounter (Signed)
Patient medication refill request for sinemet needs to come from [facility]

## 2015-01-18 NOTE — Telephone Encounter (Signed)
Patiennt came in requesting a medication refill for, carbidopa-levodopa (SINEMET CR) 50-200 MG per tablet  And  carbidopa-levodopa (SINEMET IR) 25-250 MG per tablet   Please follow up.

## 2015-01-19 ENCOUNTER — Encounter: Payer: Self-pay | Admitting: Neurology

## 2015-01-19 ENCOUNTER — Ambulatory Visit (INDEPENDENT_AMBULATORY_CARE_PROVIDER_SITE_OTHER): Payer: Self-pay | Admitting: Neurology

## 2015-01-19 VITALS — BP 136/80 | HR 68 | Ht 68.0 in | Wt 159.0 lb

## 2015-01-19 DIAGNOSIS — G20 Parkinson's disease: Secondary | ICD-10-CM

## 2015-01-19 DIAGNOSIS — E119 Type 2 diabetes mellitus without complications: Secondary | ICD-10-CM

## 2015-01-19 MED ORDER — CARBIDOPA-LEVODOPA 25-250 MG PO TABS: 2.0000 | ORAL_TABLET | Freq: Three times a day (TID) | ORAL | Status: DC

## 2015-01-19 MED ORDER — CARBIDOPA-LEVODOPA ER 50-200 MG PO TBCR: 1.0000 | EXTENDED_RELEASE_TABLET | Freq: Every day | ORAL | Status: DC

## 2015-01-19 NOTE — Progress Notes (Signed)
Brandon Carrillo was seen today in the movement disorders clinic for neurologic consultation at the request of Dr. Hyman Hopes.  The consultation is for the evaluation of PD.  The patient is accompanied by a friend, who supplements the history.  His friend also helps him at home and with bringing him to appoitments.  There is also a Orthoptist present.  I reviewed the limited medical records that are available.  The first symptom(s) the patient noticed was and that was 8 years ago.   First sx was tremor of the R hand/arm.  Still has no tremor elsewhere.  It was diagnosed in [place] but has never seen a neurologist.  He is on carbidopa/levodopa 25/250, 2 at 6 am, 2 at 2 pm and 2 at 10 pm now but started on one tablet tid originally.  He actually goes to bed at 8 pm but will awaken at 10 pm just to take the 10 pm dose of medication.  He notes wearing off of the medication at least 1 hour before the dosage.  It takes about 1 hour for the first morning pill to work.    01/06/14 update:  Patient is following up today.  A medical translator is present as is his friend, who supplements the history.  He is currently on carbidopa/levodopa 25/250, 2 tablets at 6 AM/11 AM/3 PM and carbidopa/levodopa 50/200 was added at 8 PM.  [facility]  He states that he is markedly better.  He has some tremor but stiffness is much better.  No falls.  Medication doesn't wear off until it is time for the next dosage.  Exercising some at home.  Off of antidepressants but mood is much better.  No hallucinations.  No near sycope.    01/19/15 update:  The patient is following up today.  Medical translator is present.  I have not seen the patient in just over 1 year.  He has canceled or no showed several appointments.  He remained on carbidopa/levodopa 25/250, 2 tablets at 6 AM/11 AM/3 PM and carbidopa/levodopa 50/200 at bedtime until 3 days ago when he  ran out of medication.  I reviewed records available to me since last visit.  He sustained a fall onto a glass table on 08/30/2014 and had a large laceration to the right ear.  It was estimated that he lost 1-2 quarts of blood.  During the episode, he began to have chest pain and his troponins trended upward.  Cardiology saw him and felt that this represented demand ischemia.  He was admitted and monitored.  During that hospitalization, his hemoglobin A1c was 6.8.  He was started on metformin but admits he isn't taking that.  He isn't taking any of his other medications, including his lipitor or his BP meds.   He admits to only one other fall.  He states that when is taking his medication for PD he doesn't shake.    PREVIOUS MEDICATIONS: Sinemet (no other PD meds)  ALLERGIES:  No Known Allergies  CURRENT MEDICATIONS:  Current Outpatient Prescriptions on File Prior to Visit  Medication Sig Dispense Refill  . aspirin EC 81 MG EC tablet Take 1 tablet (81 mg total) by mouth daily. 30 tablet 0  . atorvastatin (LIPITOR) 40 MG tablet Take 1 tablet (40 mg total) by mouth daily at 6 PM. (Patient not taking: Reported on 01/19/2015) 30 tablet 0  . carbidopa-levodopa (SINEMET CR) 50-200 MG per  tablet Take 1 tablet by mouth at bedtime. (Patient not taking: Reported on 01/19/2015) 30 tablet 2  . carbidopa-levodopa (SINEMET IR) 25-250 MG per tablet Take 2 tablets by mouth 3 (three) times daily. (Patient not taking: Reported on 01/19/2015) 180 tablet 5  . carvedilol (COREG) 3.125 MG tablet Take 1 tablet (3.125 mg total) by mouth 2 (two) times daily with a meal. (Patient not taking: Reported on 01/19/2015) 60 tablet 0  . losartan (COZAAR) 25 MG tablet Take 1 tablet (25 mg total) by mouth daily. (Patient not taking: Reported on 01/19/2015) 30 tablet 0  . metFORMIN (GLUCOPHAGE) 500 MG tablet Take 1 tablet (500 mg total) by mouth daily with breakfast. (Patient not taking: Reported on 01/19/2015) 30 tablet 3   No current  facility-administered medications on file prior to visit.    PAST MEDICAL HISTORY:   Past Medical History  Diagnosis Date  . Depression   . Parkinson's disease     PAST SURGICAL HISTORY:   Past Surgical History  Procedure Laterality Date  . Leg surgery      SOCIAL HISTORY:   Social History   Social History  . Marital Status: Widowed    Spouse Name: N/A  . Number of Children: N/A  . Years of Education: N/A   Occupational History  . Not on file.   Social History Main Topics  . Smoking status: Former Games developer  . Smokeless tobacco: Not on file     Comment: as a teenager  . Alcohol Use: No  . Drug Use: No  . Sexual Activity: Yes   Other Topics Concern  . Not on file   Social History Narrative    FAMILY HISTORY:   Family Status  Relation Status Death Age  . Mother Deceased     diabetes  . Father Deceased     "old age"  . Brother Alive     healthy  . Brother Alive     healthy  . Son Alive     healthy  . Son Alive     healthy  . Son Alive     healthy  . Son Alive     healthy  . Daughter Alive     healthy  . Daughter Alive     healthy    ROS:  A complete 10 system review of systems was obtained and was unremarkable apart from what is mentioned above.  PHYSICAL EXAMINATION:    VITALS:   Filed Vitals:   01/19/15 0946  BP: 136/80  Pulse: 68  Height:  (1.727 m)  Weight: 159 lb (72.122 kg)    GEN:  The patient appears stated age and is in NAD.  Smiles and laughs today.  Makes jokes. HEENT:  Normocephalic, atraumatic.  The mucous membranes are moist. The superficial temporal arteries are without ropiness or tenderness. CV:  RRR Lungs:  CTAB Neck/HEME:  There are no carotid bruits bilaterally.  Neurological examination:  Orientation: The patient is alert and oriented x3. Cranial nerves: There is good facial symmetry.  There is facial hypomimia.   The visual fields are full to confrontational testing.  The patient speaks Spanish and this  examiner does not, so I cannot assess whether the speech is fluent/clear, but it does appear to be and the translator reports it as so.  It is slightly hypophonic. Soft palate rises symmetrically and there is no tongue deviation. Hearing is intact to conversational tone. Sensation: Sensation is intact to light touch throughout Motor: Strength is  at least antigravity x 4.   Movement examination: Tone: There is significant rigidity in the UE, L more than R Abnormal movements: There is severe tremor in the UE bilaterally, L more than right Coordination:  There is significant decremation today with all forms of RAMs bilaterally, L more than right Gait and Station: The patient arises by pushing off the chair.  He shuffles and is very unsteady  ASSESSMENT/PLAN:  1.  Idiopathic Parkinson's disease.    -The patient ran out of medication a few days ago looks much worse.  I refilled it today for his previous carbidopa/levodopa 25/250, 2 tablets at 6 AM/11 AM/3 PM and 50/200 at 8 PM.  However, I only refilled it for a month as I want to see how he looks on medication.  -talked to him about importance of compliance as this has been an issue, not only with PD meds but with other meds as well.  -I talked to him today about the importance of continued exercise.  I think that he needs PT but decided to hold on that since off meds  -talked to him about DBS today. 2.  DM  -As above, he is not taking his medication and not following proper diet.  Talked to him for greater than 50% of visit about compliance and proper diet.   Total face to face time:   3. F/u 4 weeks

## 2015-02-18 ENCOUNTER — Encounter: Payer: Self-pay | Admitting: Neurology

## 2015-02-18 ENCOUNTER — Ambulatory Visit (INDEPENDENT_AMBULATORY_CARE_PROVIDER_SITE_OTHER): Payer: Self-pay | Admitting: Neurology

## 2015-02-18 VITALS — BP 140/80 | HR 90 | Ht 66.0 in | Wt 161.0 lb

## 2015-02-18 DIAGNOSIS — E119 Type 2 diabetes mellitus without complications: Secondary | ICD-10-CM

## 2015-02-18 DIAGNOSIS — G20 Parkinson's disease: Secondary | ICD-10-CM

## 2015-02-18 DIAGNOSIS — K5901 Slow transit constipation: Secondary | ICD-10-CM

## 2015-02-18 MED ORDER — PRAMIPEXOLE DIHYDROCHLORIDE 0.125 MG PO TABS: ORAL_TABLET | ORAL | Status: DC

## 2015-02-18 MED ORDER — CARBIDOPA-LEVODOPA ER 50-200 MG PO TBCR: 1.0000 | EXTENDED_RELEASE_TABLET | Freq: Every day | ORAL | Status: DC

## 2015-02-18 MED ORDER — CARBIDOPA-LEVODOPA 25-250 MG PO TABS: 2.0000 | ORAL_TABLET | Freq: Three times a day (TID) | ORAL | Status: DC

## 2015-02-18 MED ORDER — PRAMIPEXOLE DIHYDROCHLORIDE 0.5 MG PO TABS: 0.5000 mg | ORAL_TABLET | Freq: Three times a day (TID) | ORAL | Status: DC

## 2015-02-18 NOTE — Patient Instructions (Addendum)
1. Continue Carbidopa Levodopa 25/250 2 tablets 3 times daily and Carbidopa Levodopa 50/200 1 tablet at night.  2. Start mirapex (pramipexole) as follows:  0.125 mg - 1 tablet three times per day for a week, then 2 tablets three times per day for a week and then fill the 0.5 mg tablet and take that, 1 pill three times per day 3. Constipation and Parkinson's disease:   1.Rancho recipe for constipation in Parkinsons Disease:  -1 cup of bran, 2 cups of applesauce in 1 cup of prune juice  2.  Increase fiber intake (Metamucil,vegetables)  3.  Regular, moderate exercise can be beneficial.  4.  Avoid medications causing constipation, such as medications like antacids with calcium or magnesium  5.  Laxative overuse should be avoided.  6.  Stool softeners (Colace) can help with chronic constipation.  1. Continuar carbidopa Levodopa 25/250 2 comprimidos 3 veces al da y carbidopa Levodopa 50/200 1 comprimido por la noche. 2. Iniciar Mirapex (pramipexol) de la siguiente manera: 0.125 mg - 1 tableta tres veces al [employer] Br[employer] durante una semana, luego 2 tabletas tres veces al da durante una semana y luego llenar el comprimido de 0,5 mg y tomar que, 1 pastilla tres veces al da 3. El estreimiento y la enfermedad de Parkinson:  1.Rancho receta para el estreimiento en la enfermedad de Parkinson: -1 Taza de salvado, 2 tazas de pur de manzana en 1 taza de jugo de ciruela 2. Aumentar el consumo de fibra (Metamucil, verduras) 3. El ejercicio regular y moderado puede ser beneficioso. 4. Evitar medicamentos que causan estreimiento, tales como medicamentos como anticidos con calcio o magnesio 5. el uso excesivo de laxantes debe ser evitado. 6. Los ablandadores de heces (Colace) pueden ayudar con el estreimiento crnico.

## 2015-02-18 NOTE — Progress Notes (Signed)
Brandon Carrillo was seen today in the movement disorders clinic for neurologic consultation at the request of Dr. Doreene Burke.  The consultation is for the evaluation of PD.  The patient is accompanied by a friend, who supplements the history.  His friend also helps him at home and with bringing him to appoitments.  There is also a Forensic scientist present.  I reviewed the limited medical records that are available.  The first symptom(s) the patient noticed was and that was 8 years ago.   First sx was tremor of the R hand/arm.  Still has no tremor elsewhere.  It was diagnosed in [place] but has never seen a neurologist.  He is on carbidopa/levodopa 25/250, 2 at 6 am, 2 at 2 pm and 2 at 10 pm now but started on one tablet tid originally.  He actually goes to bed at 8 pm but will awaken at 10 pm just to take the 10 pm dose of medication.  He notes wearing off of the medication at least 1 hour before the dosage.  It takes about 1 hour for the first morning pill to work.    01/06/14 update:  Patient is following up today.  A medical translator is present as is his friend, who supplements the history.  He is currently on carbidopa/levodopa 25/250, 2 tablets at 6 AM/11 AM/3 PM and carbidopa/levodopa 50/200 was added at 8 PM.  We [facility]  He states that he is markedly better.  He has some tremor but stiffness is much better.  No falls.  Medication doesn't wear off until it is time for the next dosage.  Exercising some at home.  Off of antidepressants but mood is much better.  No hallucinations.  No near sycope.    01/19/15 update:  The patient is following up today.  Medical translator is present.  I have not seen the patient in just over 1 year.  He has canceled or no showed several appointments.  He remained on carbidopa/levodopa 25/250, 2 tablets at 6 AM/11 AM/3 PM and carbidopa/levodopa 50/200 at bedtime until 3 days ago when he  ran out of medication.  I reviewed records available to me since last visit.  He sustained a fall onto a glass table on 08/30/2014 and had a large laceration to the right ear.  It was estimated that he lost 1-2 quarts of blood.  During the episode, he began to have chest pain and his troponins trended upward.  Cardiology saw him and felt that this represented demand ischemia.  He was admitted and monitored.  During that hospitalization, his hemoglobin A1c was 6.8.  He was started on metformin but admits he isn't taking that.  He isn't taking any of his other medications, including his lipitor or his BP meds.   He admits to only one other fall.  He states that when is taking his medication for PD he doesn't shake.    02/18/15 update:  The patient is following up today.  Accompanied by church member who supplements hx.  Medical translator present. He has a history of Parkinson's disease.  Last visit, he did not look well because he had run out of his medications.  I refilled his medication last visit.  He is now taking carbidopa/levodopa 25/250, 2 tablets 3 times a day in addition to carbidopa/levodopa 50/200 at night.  This is a total daily levodopa dose of 1800 mg per day.  Off  his DM medications.  States that his daughter threw them at him and he lost them.  Is c/o constipation.  No falls.  No hallucinations.  Is doing PT exercises at home.  More difficult for him with transportation because church member that used to bring him to appts died.    PREVIOUS MEDICATIONS: Sinemet (no other PD meds)  ALLERGIES:  No Known Allergies  CURRENT MEDICATIONS:  Current Outpatient Prescriptions on File Prior to Visit  Medication Sig Dispense Refill  . aspirin EC 81 MG EC tablet Take 1 tablet (81 mg total) by mouth daily. 30 tablet 0  . carbidopa-levodopa (SINEMET CR) 50-200 MG per tablet Take 1 tablet by mouth at bedtime. 30 tablet 0  . carbidopa-levodopa (SINEMET IR) 25-250 MG per tablet Take 2 tablets by mouth 3  (three) times daily. 180 tablet 0   No current facility-administered medications on file prior to visit.    PAST MEDICAL HISTORY:   Past Medical History  Diagnosis Date  . Depression   . Parkinson's disease     PAST SURGICAL HISTORY:   Past Surgical History  Procedure Laterality Date  . Leg surgery      SOCIAL HISTORY:   Social History   Social History  . Marital Status: Widowed    Spouse Name: N/A  . Number of Children: N/A  . Years of Education: N/A   Occupational History  . Not on file.   Social History Main Topics  . Smoking status: Former Games developermoker  . Smokeless tobacco: Not on file     Comment: as a teenager  . Alcohol Use: No  . Drug Use: No  . Sexual Activity: Yes   Other Topics Concern  . Not on file   Social History Narrative    FAMILY HISTORY:   Family Status  Relation Status Death Age  . Mother Deceased     diabetes  . Father Deceased     "old age"  . Brother Alive     healthy  . Brother Alive     healthy  . Son Alive     healthy  . Son Alive     healthy  . Son Alive     healthy  . Son Alive     healthy  . Daughter Alive     healthy  . Daughter Alive     healthy    ROS:  A complete 10 system review of systems was obtained and was unremarkable apart from what is mentioned above.  PHYSICAL EXAMINATION:    VITALS:   Filed Vitals:   02/18/15 0948  BP: 140/80  Pulse: 90  Height: 5\' 6"  (1.676 m)  Weight: 161 lb (73.029 kg)    GEN:  The patient appears stated age and is in NAD.  Smiles and laughs today.  Makes jokes. HEENT:  Normocephalic, atraumatic.  The mucous membranes are moist. The superficial temporal arteries are without ropiness or tenderness. CV:  RRR Lungs:  CTAB Neck/HEME:  There are no carotid bruits bilaterally.  Neurological examination:  Orientation: The patient is alert and oriented x3. Cranial nerves: There is good facial symmetry.  There is facial hypomimia.   The visual fields are full to confrontational  testing.  The patient speaks Spanish and this examiner does not, so I cannot assess whether the speech is fluent/clear, but it does appear to be and the translator reports it as so.  It is slightly hypophonic. Soft palate rises symmetrically and there is no tongue deviation.  Hearing is intact to conversational tone. Sensation: Sensation is intact to light touch throughout Motor: Strength is at least antigravity x 4.   Movement examination: Tone: There is no rigidity in the RUE today but still mild - mod in the LUE Abnormal movements: There is no tremor on the right today and intermittent mild, mod (high amplitude) tremor in the L arm Coordination:  There is mild decremation today with all forms of RAMs bilaterally, L more than right Gait and Station: The patient arises by pushing off the chair.  He shuffles and is unsteady but better than last visit.  ASSESSMENT/PLAN:  1.  Idiopathic Parkinson's disease.    -Continue carbidopa/levodopa 25/250, 2 tablets at 6 AM/11 AM/3 PM and 50/200 at 8 PM.  Total daily levodopa is 1800 mg daily  -I talked to him today about the importance of continued exercise.  I think that he needs PT.  Transportation an issue but cannot afford home PT.  Church member agreed to take him.  Will send order  -start mirapex and work to 0.5 mg tid.  Risks, benefits, side effects and alternative therapies were discussed.  The opportunity to ask questions was given and they were answered to the best of my ability.  The patient expressed understanding and willingness to follow the outlined treatment protocols.  -talked to him about DBS today. 2.  DM  -As above, he is still not taking medication.  Says that he misunderstood and thought I was going to RX.  Told him that needed to f/u with PCP  3.  Constipation  -copy of rancho recipe given  4.  Follow up is anticipated in the next few months, sooner should new neurologic issues arise.

## 2015-03-02 ENCOUNTER — Ambulatory Visit: Payer: Self-pay | Attending: Family Medicine | Admitting: Family Medicine

## 2015-03-02 ENCOUNTER — Encounter: Payer: Self-pay | Admitting: Family Medicine

## 2015-03-02 VITALS — BP 112/68 | HR 65 | Temp 98.2°F | Resp 16 | Ht 66.0 in | Wt 147.0 lb

## 2015-03-02 DIAGNOSIS — F329 Major depressive disorder, single episode, unspecified: Secondary | ICD-10-CM | POA: Insufficient documentation

## 2015-03-02 DIAGNOSIS — W010XXA Fall on same level from slipping, tripping and stumbling without subsequent striking against object, initial encounter: Secondary | ICD-10-CM | POA: Insufficient documentation

## 2015-03-02 DIAGNOSIS — Y92009 Unspecified place in unspecified non-institutional (private) residence as the place of occurrence of the external cause: Secondary | ICD-10-CM | POA: Insufficient documentation

## 2015-03-02 DIAGNOSIS — Z79899 Other long term (current) drug therapy: Secondary | ICD-10-CM | POA: Insufficient documentation

## 2015-03-02 DIAGNOSIS — W19XXXA Unspecified fall, initial encounter: Secondary | ICD-10-CM

## 2015-03-02 DIAGNOSIS — E119 Type 2 diabetes mellitus without complications: Secondary | ICD-10-CM

## 2015-03-02 DIAGNOSIS — Y92099 Unspecified place in other non-institutional residence as the place of occurrence of the external cause: Secondary | ICD-10-CM

## 2015-03-02 DIAGNOSIS — Z9181 History of falling: Secondary | ICD-10-CM

## 2015-03-02 DIAGNOSIS — G20 Parkinson's disease: Secondary | ICD-10-CM | POA: Insufficient documentation

## 2015-03-02 DIAGNOSIS — Z1159 Encounter for screening for other viral diseases: Secondary | ICD-10-CM

## 2015-03-02 DIAGNOSIS — F32A Depression, unspecified: Secondary | ICD-10-CM

## 2015-03-02 DIAGNOSIS — G20A1 Parkinson's disease without dyskinesia, without mention of fluctuations: Secondary | ICD-10-CM

## 2015-03-02 DIAGNOSIS — Z7982 Long term (current) use of aspirin: Secondary | ICD-10-CM | POA: Insufficient documentation

## 2015-03-02 DIAGNOSIS — D649 Anemia, unspecified: Secondary | ICD-10-CM

## 2015-03-02 DIAGNOSIS — Z87891 Personal history of nicotine dependence: Secondary | ICD-10-CM | POA: Insufficient documentation

## 2015-03-02 DIAGNOSIS — R296 Repeated falls: Secondary | ICD-10-CM

## 2015-03-02 LAB — COMPLETE METABOLIC PANEL WITH GFR
ALT: 20 U/L (ref 9–46)
AST: 133 U/L — AB (ref 10–35)
Albumin: 3.9 g/dL (ref 3.6–5.1)
Alkaline Phosphatase: 59 U/L (ref 40–115)
BUN: 12 mg/dL (ref 7–25)
CHLORIDE: 103 mmol/L (ref 98–110)
CO2: 26 mmol/L (ref 20–31)
CREATININE: 0.52 mg/dL — AB (ref 0.70–1.25)
Calcium: 9.5 mg/dL (ref 8.6–10.3)
GFR, Est African American: >89 mL/min (ref >=60)
GFR, Est Non African American: >89 mL/min (ref >=60)
Glucose, Bld: 87 mg/dL (ref 65–99)
POTASSIUM: 4.5 mmol/L (ref 3.5–5.3)
SODIUM: 135 mmol/L (ref 135–146)
Total Bilirubin: 0.6 mg/dL (ref 0.2–1.2)
Total Protein: 7.7 g/dL (ref 6.1–8.1)

## 2015-03-02 LAB — CBC
HEMATOCRIT: 40.2 % (ref 39.0–52.0)
HEMOGLOBIN: 13.5 g/dL (ref 13.0–17.0)
MCH: 30.5 pg (ref 26.0–34.0)
MCHC: 33.6 g/dL (ref 30.0–36.0)
MCV: 90.7 fL (ref 78.0–100.0)
MPV: 8.7 fL (ref 8.6–12.4)
Platelets: 316 10*3/uL (ref 150–400)
RBC: 4.43 MIL/uL (ref 4.22–5.81)
RDW: 14.3 % (ref 11.5–15.5)
WBC: 11.6 10*3/uL — AB (ref 4.0–10.5)

## 2015-03-02 LAB — POCT GLYCOSYLATED HEMOGLOBIN (HGB A1C): HEMOGLOBIN A1C: 6

## 2015-03-02 LAB — GLUCOSE, POCT (MANUAL RESULT ENTRY): POC GLUCOSE: 103 mg/dL — AB (ref 70–99)

## 2015-03-02 NOTE — Progress Notes (Signed)
Patient ID: Brandon Carrillo, male   DOB: 12-30-1945, 69 y.o.   MRN: [id number]   Subjective:  Patient ID: Brandon Carrillo, male    DOB: [birth year]  Age: 69 y.o. MRN: [id number] Tele spanish interpreter used   CC: Fall and Diabetes   HPI Brandon Carrillo presents for fall and diabetes. He has parkinson's disease with depression. He is followed closely by his neurologist. He comes today with a close friend from church.    1. CHRONIC DIABETES  Disease Monitoring  Blood Sugar Ranges: not checking   Polyuria: no   Visual problems: no   Medication Compliance: no medications prescribed  Medication Side Effects  Hypoglycemia: no   Preventitive Health Care  Eye Exam: due   Foot Exam: done today     2. Fall: patient slipped and fell on his slippers last 3 days ago. He hit his R knee and R great toe and foot. He denies head trauma, dizziness or lightheadedness.  There is some redness, swelling and blistering. He has cane and walker at home. He does not have a rolling walker. He has not had PT. His neurologist has been working on getting both. He is uninsured except for the [employer] discount and orange card.   3. Depression: in setting of parkinson's diease. Worsened one month when a close friend at church passed away. Patient is intermittently tearful. He   Social History  Substance Use Topics  . Smoking status: Former Games developer  . Smokeless tobacco: Not on file     Comment: as a teenager  . Alcohol Use: No    Outpatient Prescriptions Prior to Visit  Medication Sig Dispense Refill  . aspirin EC 81 MG EC tablet Take 1 tablet (81 mg total) by mouth daily. 30 tablet 0  . carbidopa-levodopa (SINEMET CR) 50-200 MG tablet Take 1 tablet by mouth at bedtime. 30 tablet 3  . carbidopa-levodopa (SINEMET IR) 25-250 MG tablet Take 2 tablets by mouth 3 (three) times daily. 180 tablet 3  . pramipexole (MIRAPEX) 0.125 MG tablet 1 tablet three times per day for a week, then 2 tablets  three times per day for a week and then fill the 0.5 mg tablet and take that 63 tablet 0  . pramipexole (MIRAPEX) 0.5 MG tablet Take 1 tablet (0.5 mg total) by mouth 3 (three) times daily. 90 tablet 3   No facility-administered medications prior to visit.    ROS Review of Systems  Constitutional: Negative for fever, chills, fatigue and unexpected weight change.  Eyes: Negative for visual disturbance.  Respiratory: Negative for cough and shortness of breath.   Cardiovascular: Negative for chest pain, palpitations and leg swelling.  Gastrointestinal: Negative for nausea, vomiting, abdominal pain, diarrhea, constipation and blood in stool.  Endocrine: Negative for polydipsia, polyphagia and polyuria.  Musculoskeletal: Negative for myalgias, back pain, arthralgias, gait problem and neck pain.  Skin: Negative for rash.  Allergic/Immunologic: Negative for immunocompromised state.  Hematological: Negative for adenopathy. Does not bruise/bleed easily.  Psychiatric/Behavioral: Positive for dysphoric mood. Negative for suicidal ideas and sleep disturbance. The patient is not nervous/anxious.     Objective:  BP 112/68 mmHg  Pulse 65  Temp(Src) 98.2 F (36.8 C) (Oral)  Resp 16  Ht  (1.676 m)  Wt 147 lb (66.679 kg)  BMI 23.74 kg/m2  SpO2 98%  BP/Weight 03/02/2015 02/18/2015 01/19/2015  Systolic BP 112 140 136  Diastolic BP 68 80 80  Wt. (Lbs) 147 161 159  BMI 23.74 26 24.18  Physical Exam  Constitutional: He appears well-developed and well-nourished. No distress.  HENT:  Head: Normocephalic and atraumatic.  Neck: Normal range of motion. Neck supple.  Cardiovascular: Normal rate, regular rhythm, normal heart sounds and intact distal pulses.   Pulmonary/Chest: Effort normal and breath sounds normal.  Musculoskeletal: He exhibits no edema.  Neurological: He is alert.  Skin: Skin is warm and dry. No rash noted. No erythema.  Mild erythema with swelling on R medial knee, superficial  abrasion  Mild erythema and swelling R great toe medially with distal blister that has ruptured Proximal blister that is intact   There is not dimpling or streaking erythema   Psychiatric: He exhibits a depressed mood.   [facility] 03/02/2015 10/21/2013  Decreased Interest 1 0  Down, Depressed, Hopeless 1 1  PHQ - 2 Score 2 1  Altered sleeping 1 -  Tired, decreased energy 2 -  Change in appetite 0 -  Feeling bad or failure about yourself  0 -  Trouble concentrating 0 -  Moving slowly or fidgety/restless 2 -  Suicidal thoughts 2 -  PHQ-9 Score 9 -     Lab Results  Component Value Date   HGBA1C 6.0 03/02/2015   CBG 103  Assessment & Plan:   Problem List Items Addressed This Visit    Anemia   Relevant Orders   CBC   Depression (Chronic)    Addressed Patient opts to not add antidepressant at this time He prefers to have more time to grieve lost of friend and to start PT       Diabetes type 2, controlled (HCC) - Primary (Chronic)    Well controlled No medications needed Healthcare maintenance addressed       Relevant Orders   POCT glycosylated hemoglobin (Hb A1C) (Completed)   POCT glucose (manual entry) (Completed)   Microalbumin/Creatinine Ratio, Urine   Ambulatory referral to Ophthalmology   COMPLETE METABOLIC PANEL WITH GFR   Fall at home    A: fall at home, suspect related to gait and coordination in setting for Parkinson's disease P: Rolling walker, order provided  amb PT       Relevant Orders   Ambulatory referral to Physical Therapy   Parkinson's disease (HCC) (Chronic)    Followed by neurology Compliant with sinemet and mirapex       Relevant Orders   Ambulatory referral to Physical Therapy    Other Visit Diagnoses    Need for hepatitis C screening test        Relevant Orders    Hepatitis C antibody, reflex       No orders of the defined types were placed in this encounter.    Follow-up: No Follow-up on file.   Dessa PhiJosalyn  Shayne Deerman MD

## 2015-03-02 NOTE — Assessment & Plan Note (Signed)
Followed by neurology Compliant with sinemet and mirapex

## 2015-03-02 NOTE — Assessment & Plan Note (Signed)
Addressed Patient opts to not add antidepressant at this time He prefers to have more time to grieve lost of friend and to start PT

## 2015-03-02 NOTE — Progress Notes (Signed)
F/U DM HTN  Taking medication as prescribed  No tobacco user  No pain today

## 2015-03-02 NOTE — Assessment & Plan Note (Signed)
Well controlled No medications needed Healthcare maintenance addressed

## 2015-03-02 NOTE — Patient Instructions (Addendum)
Brandon Carrillo was seen today for hypertension and diabetes.  Diagnoses and all orders for this visit:  Controlled type 2 diabetes mellitus without complication, without long-term current use of insulin (HCC) -     POCT glycosylated hemoglobin (Hb A1C) -     POCT glucose (manual entry) -     Microalbumin/Creatinine Ratio, Urine -     Ambulatory referral to Ophthalmology  Parkinson's disease [facility](HCC) -     Ambulatory referral to Physical Therapy  Depression  Fall at home, initial encounter -     Ambulatory referral to Physical Therapy  Anemia, unspecified anemia type -     CBC  Need for hepatitis C screening test -     Hepatitis C antibody, reflex   Take Rx for rolling walker to medical supply store, [facility]   Address: [address], [place], [place] [address]  Phone: [phone]  If the walkers are too expensive, please let me know and I will ask the social worker for resources.    F/u in 6 weeks for depression and to check R knee and foot following fall   Dr. Armen PickupFunches

## 2015-03-02 NOTE — Assessment & Plan Note (Signed)
A: fall at home, suspect related to gait and coordination in setting for Parkinson's disease P: Rolling walker, order provided  amb PT

## 2015-03-03 LAB — HEPATITIS C ANTIBODY: HCV Ab: NEGATIVE

## 2015-03-03 LAB — MICROALBUMIN / CREATININE URINE RATIO
Creatinine, Urine: 95 mg/dL (ref 20–370)
Microalb Creat Ratio: 4 mcg/mg creat (ref <30)
Microalb, Ur: 0.4 mg/dL

## 2015-04-02 ENCOUNTER — Telehealth: Payer: Self-pay | Admitting: *Deleted

## 2015-04-02 NOTE — Telephone Encounter (Signed)
Unable to contact pt  Mail box is full x2

## 2015-04-02 NOTE — Telephone Encounter (Signed)
-----   Message from Dessa PhiJosalyn Funches, MD sent at 03/03/2015  8:56 AM EST ----- Normal CMP Except elevated AST, etiology unclear Patient to decrease alcohol intake if drinkin Screening hep C negative Normal CBC  Normal urine microalbumin

## 2015-04-29 ENCOUNTER — Ambulatory Visit (INDEPENDENT_AMBULATORY_CARE_PROVIDER_SITE_OTHER): Payer: Self-pay | Admitting: Neurology

## 2015-04-29 ENCOUNTER — Encounter: Payer: Self-pay | Admitting: Neurology

## 2015-04-29 VITALS — BP 110/64 | HR 80 | Ht 66.0 in | Wt 164.0 lb

## 2015-04-29 DIAGNOSIS — G20 Parkinson's disease: Secondary | ICD-10-CM

## 2015-04-29 MED ORDER — CARBIDOPA-LEVODOPA 25-250 MG PO TABS: 2.0000 | ORAL_TABLET | Freq: Three times a day (TID) | ORAL | Status: DC

## 2015-04-29 MED ORDER — CARBIDOPA-LEVODOPA ER 50-200 MG PO TBCR: 1.0000 | EXTENDED_RELEASE_TABLET | Freq: Every day | ORAL | Status: DC

## 2015-04-29 MED FILL — CARBIDOPA-LEVO ER 50-200 TA: 50-200 | 30 days supply | Qty: 30 | Fill #0

## 2015-04-29 MED FILL — PRAMIPEXOLE 0.5 MG TABLET: 0.5 | 30 days supply | Qty: 90 | Fill #1

## 2015-04-29 MED FILL — CARBIDOPA-LEVODOPA 25-250 T: 25-250 | 30 days supply | Qty: 180 | Fill #0

## 2015-04-29 NOTE — Progress Notes (Signed)
Brandon Carrillo was seen today in the movement disorders clinic for neurologic consultation at the request of Dr. Doreene Burke.  The consultation is for the evaluation of PD.  The patient is accompanied by a friend, who supplements the history.  His friend also helps him at home and with bringing him to appoitments.  There is also a Forensic scientist present.  I reviewed the limited medical records that are available.  The first symptom(s) the patient noticed was and that was 8 years ago.   First sx was tremor of the R hand/arm.  Still has no tremor elsewhere.  It was diagnosed in [place] but has never seen a neurologist.  He is on carbidopa/levodopa 25/250, 2 at 6 am, 2 at 2 pm and 2 at 10 pm now but started on one tablet tid originally.  He actually goes to bed at 8 pm but will awaken at 10 pm just to take the 10 pm dose of medication.  He notes wearing off of the medication at least 1 hour before the dosage.  It takes about 1 hour for the first morning pill to work.    01/06/14 update:  Patient is following up today.  A medical translator is present as is his friend, who supplements the history.  He is currently on carbidopa/levodopa 25/250, 2 tablets at 6 AM/11 AM/3 PM and carbidopa/levodopa 50/200 was added at 8 PM.  We [facility]  He states that he is markedly better.  He has some tremor but stiffness is much better.  No falls.  Medication doesn't wear off until it is time for the next dosage.  Exercising some at home.  Off of antidepressants but mood is much better.  No hallucinations.  No near sycope.    01/19/15 update:  The patient is following up today.  Medical translator is present.  I have not seen the patient in just over 1 year.  He has canceled or no showed several appointments.  He remained on carbidopa/levodopa 25/250, 2 tablets at 6 AM/11 AM/3 PM and carbidopa/levodopa 50/200 at bedtime until 3 days ago when he  ran out of medication.  I reviewed records available to me since last visit.  He sustained a fall onto a glass table on 08/30/2014 and had a large laceration to the right ear.  It was estimated that he lost 1-2 quarts of blood.  During the episode, he began to have chest pain and his troponins trended upward.  Cardiology saw him and felt that this represented demand ischemia.  He was admitted and monitored.  During that hospitalization, his hemoglobin A1c was 6.8.  He was started on metformin but admits he isn't taking that.  He isn't taking any of his other medications, including his lipitor or his BP meds.   He admits to only one other fall.  He states that when is taking his medication for PD he doesn't shake.    02/18/15 update:  The patient is following up today.  Accompanied by church member who supplements hx.  Medical translator present. He has a history of Parkinson's disease.  Last visit, he did not look well because he had run out of his medications.  I refilled his medication last visit.  He is now taking carbidopa/levodopa 25/250, 2 tablets 3 times a day in addition to carbidopa/levodopa 50/200 at night.  This is a total daily levodopa dose of 1800 mg per day.  Off  his DM medications.  States that his daughter threw them at him and he lost them.  Is c/o constipation.  No falls.  No hallucinations.  Is doing PT exercises at home.  More difficult for him with transportation because church member that used to bring him to appts died.    05-01-15 update:  The patient is following up today, accompanied by both a church member (who supplements the history) as well as a Orthoptist.  He is on carbidopa/levodopa 25/250, 2 tablets at 6 AM/11 AM/3 PM and then carbidopa/levodopa 50/200 at 8 PM.  I started him on pramipexole 0.5 mg 3 times a day last visit but he is only taking it one time a day (instructions on bottle are in english but instructions on AVS last visit were written out in spanish).  He was  also referred for physical therapy, although it turns out that the rehab unit never contacted him.  I did call them yesterday about this and they were to call him immediately.  He did fall in early November.  He apparently tripped over his slippers.  He did not get hurt and he did follow up with his primary care physician.  No new medications were administered.  It was felt that his diabetes is under good control and no medications were needed for that.  PREVIOUS MEDICATIONS: Sinemet (no other PD meds)  ALLERGIES:  No Known Allergies  CURRENT MEDICATIONS:  Current Outpatient Prescriptions on File Prior to Visit  Medication Sig Dispense Refill  . aspirin EC 81 MG EC tablet Take 1 tablet (81 mg total) by mouth daily. 30 tablet 0  . pramipexole (MIRAPEX) 0.5 MG tablet Take 1 tablet (0.5 mg total) by mouth 3 (three) times daily. 90 tablet 3   No current facility-administered medications on file prior to visit.    PAST MEDICAL HISTORY:   Past Medical History  Diagnosis Date  . Depression   . Parkinson's disease (HCC)     PAST SURGICAL HISTORY:   Past Surgical History  Procedure Laterality Date  . Leg surgery      SOCIAL HISTORY:   Social History   Social History  . Marital Status: Widowed    Spouse Name: N/A  . Number of Children: N/A  . Years of Education: N/A   Occupational History  . Not on file.   Social History Main Topics  . Smoking status: Former Games developer  . Smokeless tobacco: Not on file     Comment: as a teenager  . Alcohol Use: No  . Drug Use: No  . Sexual Activity: Yes   Other Topics Concern  . Not on file   Social History Narrative    FAMILY HISTORY:   Family Status  Relation Status Death Age  . Mother Deceased     diabetes  . Father Deceased     "old age"  . Brother Alive     healthy  . Brother Alive     healthy  . Son Alive     healthy  . Son Alive     healthy  . Son Alive     healthy  . Son Alive     healthy  . Daughter Alive      healthy  . Daughter Alive     healthy    ROS:  A complete 10 system review of systems was obtained and was unremarkable apart from what is mentioned above.  PHYSICAL EXAMINATION:    VITALS:   Filed Vitals:  04/29/15 1035  BP: 110/64  Pulse: 80  Height: 5\' 6"  (1.676 m)  Weight: 164 lb (74.39 kg)    GEN:  The patient appears stated age and is in NAD.  Smiles and laughs today.  Makes jokes. HEENT:  Normocephalic, atraumatic.  The mucous membranes are moist. The superficial temporal arteries are without ropiness or tenderness. CV:  RRR Lungs:  CTAB Neck/HEME:  There are no carotid bruits bilaterally.  Neurological examination:  Orientation: The patient is alert and oriented x3. Cranial nerves: There is good facial symmetry.  There is facial hypomimia.   The visual fields are full to confrontational testing.  The patient speaks Spanish and this examiner does not, so I cannot assess whether the speech is fluent/clear, but it does appear to be and the translator reports it as so.  It is slightly hypophonic. Soft palate rises symmetrically and there is no tongue deviation. Hearing is intact to conversational tone. Sensation: Sensation is intact to light touch throughout Motor: Strength is at least antigravity x 4.   Movement examination: Tone: There is no rigidity in the UE today Abnormal movements: There is no tremor on the right today and intermittent tremor on the L but it is rare Coordination:  There is mild decremation today with all forms of RAMs bilaterally, L more than right Gait and Station: The patient can arise without using the hands.  He shuffles and has a festinating gait.  ASSESSMENT/PLAN:  1.  Idiopathic Parkinson's disease.    -Continue carbidopa/levodopa 25/250, 2 tablets at 6 AM/11 AM/3 PM and 50/200 at 8 PM.  Total daily levodopa is 1800 mg daily  -I talked to him today about the importance of continued exercise.  Encouraged PT and talked to them about  transporation issues again today.  -increase mirapex to 0.5 mg tid.  Risks, benefits, side effects and alternative therapies were discussed.  The opportunity to ask questions was given and they were answered to the best of my ability.  The patient expressed understanding and willingness to follow the outlined treatment protocols.  -talked to him about DBS today. 2.  DM  -controlled without medication  3.  Constipation  -copy of rancho recipe given  4.  Follow up is anticipated in the next few months, sooner should new neurologic issues arise.  Much greater than 50% of this visit was spent in counseling with the patient.  Total face to face time:  30 min

## 2015-04-29 NOTE — Patient Instructions (Addendum)
1. Take Pramipexole 0.5 mg - 1 tablet 3 times daily with your Carbidopa Levodopa dosages.   Tomar Pramipexole 0.5mg   1 tableta 3 veces al dia con su dosis de Carbidopa Levodopa.

## 2015-06-01 MED FILL — CARBIDOPA-LEVODOPA 25-250 T: 25-250 | 30 days supply | Qty: 180 | Fill #1

## 2015-06-22 ENCOUNTER — Ambulatory Visit: Payer: Self-pay | Attending: Neurology | Admitting: Physical Therapy

## 2015-06-22 DIAGNOSIS — R293 Abnormal posture: Secondary | ICD-10-CM | POA: Insufficient documentation

## 2015-06-22 DIAGNOSIS — R258 Other abnormal involuntary movements: Secondary | ICD-10-CM | POA: Insufficient documentation

## 2015-06-22 DIAGNOSIS — R269 Unspecified abnormalities of gait and mobility: Secondary | ICD-10-CM | POA: Insufficient documentation

## 2015-06-22 MED FILL — CARBIDOPA-LEVODOPA 25-250 T: 25-250 | 30 days supply | Qty: 180 | Fill #2

## 2015-06-22 NOTE — Therapy (Signed)
[facility] [address] [place], [place], [address] Phone: [phone]   Fax:  [phone]  Physical Therapy Evaluation  Patient Details  Name: Brandon Carrillo MRN: [id number] Date of Birth: October 03, 1945 Referring Provider: Kerin Salen, DO  Encounter Date: 06/22/2015      PT End of Session - 06/22/15 1608    Visit Number 1   Number of Visits 17   Date for PT Re-Evaluation 08/22/15   Authorization Type CAFA 100%   Authorization Time Period 01/19/15-07/19/15   PT Start Time 1020   PT Stop Time 1102   PT Time Calculation (min) 42 min   Activity Tolerance Patient tolerated treatment well   Behavior During Therapy [facility] for tasks assessed/performed      Past Medical History  Diagnosis Date  . Depression   . Parkinson's disease [facility])     Past Surgical History  Procedure Laterality Date  . Leg surgery      There were no vitals filed for this visit.  Visit Diagnosis:  Abnormality of gait  Abnormal posture  Postural instability  Bradykinesia      Subjective Assessment - 06/22/15 1026    Subjective Pt is a 70 year old male who presents to OP PT with history of Parkinson's disease.  He reports mild low back pain and knee pain at times in the morning.  He reports some stiffness in legs, low back, weakness in legs.  He notes some episodes of feet getting stuck with gait.  He reports no falls in the past 6 months.  He has a cane, but he does not use regularly.   Patient Stated Goals Pt's goal for physical therapy is to stay fit, continue to be functional to be able to do things for myself.   Currently in Pain? Yes   Pain Score 6    Pain Location Back   Pain Orientation Lower;Right;Left   Pain Descriptors / Indicators Aching   Pain Type Acute pain   Pain Onset 1 to 4 weeks ago   Pain Frequency Intermittent   Aggravating Factors  using weights for exercise (at home); pain worse in the morning   Pain Relieving  Factors exercise helps   Effect of Pain on Daily Activities PT will attempt to address pain through exercise, education, positioning    Multiple Pain Sites Yes   Pain Score 4   Pain Location Knee  and lower legs   Pain Orientation Right;Left   Pain Descriptors / Indicators --  Heat   Pain Type Acute pain   Pain Onset 1 to 4 weeks ago   Pain Frequency Intermittent   Aggravating Factors  Comes and goes   Pain Relieving Factors Exercise helps            [facility] PT Assessment - 06/22/15 1037    Assessment   Medical Diagnosis Parkinson's disease   Referring Provider Lurena Joiner Tat, DO   Onset Date/Surgical Date 04/29/15  Most recent doctor's visit   Precautions   Precautions Fall   Balance Screen   Has the patient fallen in the past 6 months No  History of falls, not in the past 6 months   Has the patient had a decrease in activity level because of a fear of falling?  Yes   Is the patient reluctant to leave their home because of a fear of falling?  Yes   Home Nurse, mental health Private residence   Living Arrangements Other (Comment)  Lives  in camper behind son's home   Available Help at Discharge Family   Type of Home Mobile home   Home Access Stairs to enter   Entrance Stairs-Number of Steps 2   Entrance Stairs-Rails Left   Home Layout One level   Home Equipment [place] - single point   Prior Function   Level of Independence Independent with basic ADLs;Independent with household mobility without device   Leisure Reports exercising at home   Posture/Postural Control   Posture/Postural Control Postural limitations   Postural Limitations Forward head;Rounded Shoulders;Flexed trunk  L shoulder lower than R   ROM / Strength   AROM / PROM / Strength Strength   Transfers   Transfers Sit to Stand;Stand to Sit   Sit to Stand 5: Supervision;Without upper extremity assist;From chair/3-in-1   Sit to Stand Details (indicate cue type and reason) Initial hesitation in  forward lean upon trying to stand   Five time sit to stand comments  21.04 sec   Stand to Sit 5: Supervision;Without upper extremity assist;To chair/3-in-1   Ambulation/Gait   Ambulation/Gait Yes   Ambulation/Gait Assistance 5: Supervision   Ambulation Distance (Feet) 120 Feet   Assistive device None   Gait Pattern Step-through pattern;Decreased arm swing - right;Decreased arm swing - left;Decreased step length - right;Decreased step length - left;Trendelenburg;Lateral trunk lean to left;Decreased trunk rotation;Narrow base of support;Poor foot clearance - left;Poor foot clearance - right  Trendelenburg type pattern on L   Ambulation Surface Level;Indoor   Gait velocity 10.31= 3.18 ft/sec   Gait Comments 360 turn to R 3.8 seconds, to L 4.96 seconds with decreased stance time on LLE, decreased foot clearance   Standardized Balance Assessment   Standardized Balance Assessment Timed Up and Go Test   Timed Up and Go Test   Normal TUG (seconds) 19.26   Manual TUG (seconds) 16.46   TUG Comments Scores on TUG >13.5 seconds indicate increased fall risk.                             PT Short Term Goals - 06/22/15 1618    PT SHORT TERM GOAL #1   Title Pt will perform HEP for improved balance, transfers, gait, decreased pain, independently.  TARGET 07/20/15   Time 4   Period Weeks   Status New   PT SHORT TERM GOAL #2   Title Pt will improve TUG score to less than or equal to 16 seconds for decreased fall risk.   Time 4   Period Weeks   Status New   PT SHORT TERM GOAL #3   Title Pt will improve 5x sit<>stand transfers to less than or equal to 18 seconds for improved transfer efficiency and safety.   Time 4   Period Weeks   Status New   PT SHORT TERM GOAL #4   Title Pt will verbalize/demonstrate understanding of body mechanics/posture and positioning with ADLs for decreased back pain.   Time 4   Period Weeks   Status New           PT Long Term Goals - 06/22/15  1620    PT LONG TERM GOAL #1   Title Pt will verbalize understanding of fall prevention within the home environment.  TARGET 08/22/15   Time 8   Period Weeks   Status New   PT LONG TERM GOAL #2   Title Pt will improve TUG score to less than or equal to 13.5 seconds  for decreased fall risk.   Time 8   Period Weeks   Status New   PT LONG TERM GOAL #3   Title Pt will improve 5x sit<>stand to less than or equal to 15 seconds for improved efficiency and safety with transfers.   Time 8   Period Weeks   Status New   PT LONG TERM GOAL #4   Title Pt will improve turning 360 degrees to R and to L in <3 seconds for improved safety with turns.   Time 8   Period Weeks   Status New   PT LONG TERM GOAL #5   Title Pt will perform floor>stand transfer with UE support, modified independently, for improved fall recovery.   Time 8   Period Weeks   Status New               Plan - 2015/07/19 1609    Clinical Impression Statement Pt is a 70 year old male who presents to OP PT with history of Parkinson's disease.  Recent visit to Dr. Arbutus Leas, with referral to physical therapy.  Per MD report, pt had a fall in Novebmer 2016.  Pt presents with at least 7 co-morbidities per PMH.  Pt has inconsistent family assistance within the home.  Pt presents to OPPT with bradykinesia, postural abnormalities, tremor, decreased timing and coordination of gait, decreased transfer ability, decreased balance, and history of falls.  Pt is at risk of falls per TUG score and is noted to have slowed transfer ability.  Pt's clinical presentation is evolving due to progressive nature of Parkinson's disease.  Pt would benefit from skilled physical therapy to address the above stated deficits to improve functional mobility and to decrease rall risk.  Interpreter present during PT eval today.   Pt will benefit from skilled therapeutic intervention in order to improve on the following deficits Abnormal gait;Decreased balance;Decreased  mobility;Decreased coordination;Decreased strength;Difficulty walking;Postural dysfunction;Pain   Rehab Potential Good   PT Frequency 2x / week   PT Duration 8 weeks  plus evaluation   PT Treatment/Interventions ADLs/Self Care Home Management;Therapeutic exercise;Therapeutic activities;Functional mobility training;Gait training;Balance training;Neuromuscular re-education;Patient/family education   PT Next Visit Plan Initiate HEP for posture, transfers and standing balance-try seated/standing PWR! Moves   Consulted and Agree with Plan of Care Patient          G-Codes - 19-Jul-2015 1624    Functional Assessment Tool Used 5x sit<>stand 21.04 sec, 360 degree turns to R 3.81 sec, L 4.96 sec, TUG 19.26 sec, TUG manual 16.46, gait velocity 3.18 ft/sec   Functional Limitation Mobility: Walking and moving around   Mobility: Walking and Moving Around Current Status [phone]) At least 40 percent but less than 60 percent impaired, limited or restricted   Mobility: Walking and Moving Around Goal Status [phone]) At least 20 percent but less than 40 percent impaired, limited or restricted       Problem List Patient Active Problem List   Diagnosis Date Noted  . Diabetes type 2, controlled (HCC) 03/02/2015  . Fall at home 03/02/2015  . Anemia 03/02/2015  . At high risk for falls 03/02/2015  . Acute blood loss anemia 09/01/2014  . NSTEMI (non-ST elevated myocardial infarction) (HCC) 08/30/2014  . Parkinson's disease (HCC) 10/21/2013  . Depression 10/21/2013  . Midline thoracic back pain 10/21/2013    Marytza Grandpre W. 07-19-2015, 4:26 PM  Gean Maidens., PT  [facility] [address] [place], [place], [address] Phone: [phone]   Fax:  [phone]  Name: Peyton Spengler MRN: [id number] Date of Birth: 1946-02-22

## 2015-06-28 MED FILL — CARBIDOPA-LEVO ER 50-200 TA: 50-200 | 30 days supply | Qty: 30 | Fill #1

## 2015-06-28 MED FILL — PRAMIPEXOLE 0.5 MG TABLET: 0.5 | 30 days supply | Qty: 90 | Fill #2

## 2015-06-29 ENCOUNTER — Ambulatory Visit: Payer: Self-pay | Attending: Neurology | Admitting: Physical Therapy

## 2015-06-29 DIAGNOSIS — R258 Other abnormal involuntary movements: Secondary | ICD-10-CM | POA: Insufficient documentation

## 2015-06-29 DIAGNOSIS — R269 Unspecified abnormalities of gait and mobility: Secondary | ICD-10-CM | POA: Insufficient documentation

## 2015-06-29 DIAGNOSIS — R293 Abnormal posture: Secondary | ICD-10-CM | POA: Insufficient documentation

## 2015-06-29 NOTE — Therapy (Signed)
[facility] [address] [place], [place], [address] Phone: [phone]   Fax:  [phone]  Physical Therapy Treatment  Patient Details  Name: Brandon Carrillo MRN: [id number] Date of Birth: 06/14/1945 Referring Provider: Kerin Salenebecca Tat, DO  Encounter Date: 06/29/2015      PT End of Session - 06/29/15 1335    Visit Number 2   Number of Visits 17   Date for PT Re-Evaluation 08/22/15   Authorization Type CAFA 100%   Authorization Time Period 01/19/15-07/19/15   PT Start Time 1018   PT Stop Time 1103   PT Time Calculation (min) 45 min   Activity Tolerance Patient tolerated treatment well   Behavior During Therapy [facility] for tasks assessed/performed      Past Medical History  Diagnosis Date  . Depression   . Parkinson's disease [facility](HCC)     Past Surgical History  Procedure Laterality Date  . Leg surgery      There were no vitals filed for this visit.  Visit Diagnosis:  Abnormal posture  Bradykinesia      Subjective Assessment - 06/29/15 1025    Subjective Denies falls, changes or pain.   Patient Stated Goals Pt's goal for physical therapy is to stay fit, continue to be functional to be able to do things for myself.   Currently in Pain? No/denies       Interpreter present for entire session.  See HEP for additional details of treatment.       [facility] PT Treatment/Exercise - 06/29/15 1321    Posture/Postural Control   Posture/Postural Control Postural limitations   Postural Limitations Forward head;Rounded Shoulders;Flexed trunk   Knee/Hip Exercises: Aerobic   Stationary Bike Scifit level 1.5 all 4 extremities x 8 minutes with cues for full ROM   Shoulder Exercises: Seated   Retraction Both;15 reps;Limitations   Retraction Limitations needs tactile cues to maximize range   Shoulder Exercises: Standing   Other Standing Exercises Standing in corner for UE stretching x 60 seconds-provided as HEP   Other Standing Exercises Standing wall posture x 60 seconds with pillow for head-provided as HEP           PWR ([facility]) - 06/29/15 1331    PWR! exercises Moves in sitting   PWR! Up x 20 with tactile, verbal and visual cues   PWR! Rock x 10 with tactile, verbal and visual cues   PWR! Step x 15 with 3" foam in floor to encourage increased hip flexion   Comments In sitting;  Pt with difficulty following instructions at times despite verbal, visual and tactile cues.  Extremely tight bil UE's and forward head and shoulders making movement difficulty with exercises.             PT Education - 06/29/15 1334    Education provided Yes   Education Details HEP, importance of posture in walking and decreasing festinating gait   Person(s) Educated Patient;Other (comment)  interpreter present and reviewed HEP in Spanish   Methods Explanation;Demonstration;Tactile cues;Verbal cues;Handout   Comprehension Verbalized understanding;Need further instruction;Verbal cues required;Tactile cues required          PT Short Term Goals - 06/22/15 1618    PT SHORT TERM GOAL #1   Title Pt will perform HEP for improved balance, transfers, gait, decreased pain, independently.  TARGET 07/20/15   Time 4   Period Weeks   Status New   PT SHORT TERM GOAL #2   Title Pt will improve TUG score  to less than or equal to 16 seconds for decreased fall risk.   Time 4   Period Weeks   Status New   PT SHORT TERM GOAL #3   Title Pt will improve 5x sit<>stand transfers to less than or equal to 18 seconds for improved transfer efficiency and safety.   Time 4   Period Weeks   Status New   PT SHORT TERM GOAL #4   Title Pt will verbalize/demonstrate understanding of body mechanics/posture and positioning with ADLs for decreased back pain.   Time 4   Period Weeks   Status New           PT Long Term Goals - 06/22/15 1620    PT LONG TERM GOAL #1   Title Pt will verbalize understanding of fall prevention within  the home environment.  TARGET 08/22/15   Time 8   Period Weeks   Status New   PT LONG TERM GOAL #2   Title Pt will improve TUG score to less than or equal to 13.5 seconds for decreased fall risk.   Time 8   Period Weeks   Status New   PT LONG TERM GOAL #3   Title Pt will improve 5x sit<>stand to less than or equal to 15 seconds for improved efficiency and safety with transfers.   Time 8   Period Weeks   Status New   PT LONG TERM GOAL #4   Title Pt will improve turning 360 degrees to R and to L in <3 seconds for improved safety with turns.   Time 8   Period Weeks   Status New   PT LONG TERM GOAL #5   Title Pt will perform floor>stand transfer with UE support, modified independently, for improved fall recovery.   Time 8   Period Weeks   Status New               Plan - 06/29/15 1336    Clinical Impression Statement Pt needing extensive cues for seated PWR! exercises today therefore did not provide as part of HEP.  Continues with postural limitations impacting overall mobility as well as safety with gait.  Cont PT per POC.   Pt will benefit from skilled therapeutic intervention in order to improve on the following deficits Abnormal gait;Decreased balance;Decreased mobility;Decreased coordination;Decreased strength;Difficulty walking;Postural dysfunction;Pain   Rehab Potential Good   PT Frequency 2x / week   PT Duration 8 weeks  plus evaluation   PT Treatment/Interventions ADLs/Self Care Home Management;Therapeutic exercise;Therapeutic activities;Functional mobility training;Gait training;Balance training;Neuromuscular re-education;Patient/family education   PT Next Visit Plan Review HEP for posture, transfers and standing balance, discuss PWR! Up in daily activities   Consulted and Agree with Plan of Care Patient        Problem List Patient Active Problem List   Diagnosis Date Noted  . Diabetes type 2, controlled (HCC) 03/02/2015  . Fall at home 03/02/2015  . Anemia  03/02/2015  . At high risk for falls 03/02/2015  . Acute blood loss anemia 09/01/2014  . NSTEMI (non-ST elevated myocardial infarction) (HCC) 08/30/2014  . Parkinson's disease (HCC) 10/21/2013  . Depression 10/21/2013  . Midline thoracic back pain 10/21/2013    Newell Coral 06/29/2015, 1:38 PM  [facility] [address] [place], [place], [address] Phone: [phone]   Fax:  [phone]  Name: Brandon Carrillo MRN: [id number] Date of Birth: 1945-09-11    Newell Coral, PTA [facility] 06/29/2015 1:38  PM Phone: [phone] Fax: [phone]

## 2015-06-29 NOTE — Patient Instructions (Signed)
Exercise for stooped posture    Prese de espalda a la pared, con la cabeza, los hombros, la cola y los talones tocando la pared. Mantenga 60 segundos, luego aljese dos pasos de la pared y [employer] Incmantenga la postura. Merrily BrittleVuelva hacia atrs y corrija la posicin si es necesario. Repita 2 veces. Haga 3 sesiones por Fut[employer] traderda.  http://gt2.exer.us/687   Copyright  VHI. All rights reserved.   Posture - Sitting    Sintese derecho, con la cabeza mirando hacia adelante. Intente colocar un rollo detrs de la cintura para sostener la columna lumbar. Mantenga los hombros relajados, evite redondear la espalda. Mantenga las caderas niveladas con las rodillas. Evite cruzar las piernas por perodos prolongados.   Copyright  VHI. All rights reserved.   Scapular Retraction: Elbow Flexion (Standing)    Con codos doblados a 90, una los omplatos y rote los brazos hacia afuera, manteniendo los codos doblados. Repita 10 veces por rutina. Realice 2 rutinas por sesin. Realice 3 sesiones por da.  http://orth.exer.us/948   Copyright  VHI. All rights reserved.  Flexibility: Corner Stretch    De pie, en una esquina con las manos ligeramente por encima de la altura de los hombros y los pies a 12 cm. de la esquina inclnese hacia adelante hasta sentir un estiramiento a travs del pecho. Sostenga 60 segundos. Repita 2 veces por rutina. Realice 3 sesiones por da.  http://orth.exer.us/342   Copyright  VHI. All rights reserved.

## 2015-07-01 ENCOUNTER — Ambulatory Visit: Payer: Self-pay | Admitting: Physical Therapy

## 2015-07-06 ENCOUNTER — Ambulatory Visit: Payer: Self-pay | Admitting: Physical Therapy

## 2015-07-07 ENCOUNTER — Ambulatory Visit: Payer: Self-pay | Admitting: Physical Therapy

## 2015-07-07 DIAGNOSIS — R293 Abnormal posture: Secondary | ICD-10-CM

## 2015-07-07 DIAGNOSIS — R269 Unspecified abnormalities of gait and mobility: Secondary | ICD-10-CM

## 2015-07-07 NOTE — Therapy (Signed)
[facility] [address] [place], [place], [address] Phone: [phone]   Fax:  [phone]  Physical Therapy Treatment  Patient Details  Name: Brandon Carrillo MRN: [id number] Date of Birth: [birth year] Referring Provider: Kerin Salen, DO  Encounter Date: 07/07/2015      PT End of Session - 07/07/15 1250    Visit Number 3   Number of Visits 17   Date for PT Re-Evaluation 08/22/15   Authorization Type CAFA 100%   Authorization Time Period 01/19/15-07/19/15   PT Start Time 1023   PT Stop Time 1104   PT Time Calculation (min) 41 min   Equipment Utilized During Treatment Gait belt   Activity Tolerance Patient tolerated treatment well   Behavior During Therapy [facility] for tasks assessed/performed      Past Medical History  Diagnosis Date  . Depression   . Parkinson's disease [facility])     Past Surgical History  Procedure Laterality Date  . Leg surgery      There were no vitals filed for this visit.  Visit Diagnosis:  Abnormal posture  Abnormality of gait      Subjective Assessment - 07/07/15 1249    Subjective Denies falls or changes since last visit.  Reports exercises going well at home.   Patient is accompained by: Interpreter   Patient Stated Goals Pt's goal for physical therapy is to stay fit, continue to be functional to be able to do things for myself.   Currently in Pain? No/denies          Reviewed and performed HEP provided previous session of standing wall posture x 60 seconds x 2, corner stretch x 60 seconds, and seated scapular retraction x 15.  Performed seated reach forward, floor to ceiling to forward x 10 reps with verbal, visual and tactile cues.  Performed seated PWR! Moves x 10 each with visual, verbal and tactile cues.    Gait x >800' working on upright posture, cadence, step length and heel strike.  Pt with forward head and trunk and festinating gait with no heel strike  bilaterally or armswing.  Discussed importance of stopping and performing PWR! Up when festinating gait begins.         PT Education - 07/07/15 1249    Education provided Yes   Education Details Importance of posture and PWR! up with gait, armswing and increased step length   Person(s) Educated Patient;Other (comment)  interpreter   Methods Explanation;Demonstration;Verbal cues   Comprehension Verbalized understanding          PT Short Term Goals - 06/22/15 1618    PT SHORT TERM GOAL #1   Title Pt will perform HEP for improved balance, transfers, gait, decreased pain, independently.  TARGET 07/20/15   Time 4   Period Weeks   Status New   PT SHORT TERM GOAL #2   Title Pt will improve TUG score to less than or equal to 16 seconds for decreased fall risk.   Time 4   Period Weeks   Status New   PT SHORT TERM GOAL #3   Title Pt will improve 5x sit<>stand transfers to less than or equal to 18 seconds for improved transfer efficiency and safety.   Time 4   Period Weeks   Status New   PT SHORT TERM GOAL #4   Title Pt will verbalize/demonstrate understanding of body mechanics/posture and positioning with ADLs for decreased back pain.   Time 4   Period Weeks  Status New           PT Long Term Goals - 06/22/15 1620    PT LONG TERM GOAL #1   Title Pt will verbalize understanding of fall prevention within the home environment.  TARGET 08/22/15   Time 8   Period Weeks   Status New   PT LONG TERM GOAL #2   Title Pt will improve TUG score to less than or equal to 13.5 seconds for decreased fall risk.   Time 8   Period Weeks   Status New   PT LONG TERM GOAL #3   Title Pt will improve 5x sit<>stand to less than or equal to 15 seconds for improved efficiency and safety with transfers.   Time 8   Period Weeks   Status New   PT LONG TERM GOAL #4   Title Pt will improve turning 360 degrees to R and to L in <3 seconds for improved safety with turns.   Time 8   Period Weeks    Status New   PT LONG TERM GOAL #5   Title Pt will perform floor>stand transfer with UE support, modified independently, for improved fall recovery.   Time 8   Period Weeks   Status New               Plan - 07/07/15 1251    Clinical Impression Statement Pt continues to need verbal and tactile cues for exercises and gait through out session.  Improved response today with cues.  Continue with PT per POC.   Pt will benefit from skilled therapeutic intervention in order to improve on the following deficits Abnormal gait;Decreased balance;Decreased mobility;Decreased coordination;Decreased strength;Difficulty walking;Postural dysfunction;Pain   Rehab Potential Good   PT Frequency 2x / week   PT Duration 8 weeks  plus evaluation   PT Treatment/Interventions ADLs/Self Care Home Management;Therapeutic exercise;Therapeutic activities;Functional mobility training;Gait training;Balance training;Neuromuscular re-education;Patient/family education   PT Next Visit Plan PWR! moves seated and possibly provide as HEP, continue gait working on posture and intensity, weight shifting activities at [employer] Mutualcounter   Consulted and Agree with Plan of Care Patient        Problem List Patient Active Problem List   Diagnosis Date Noted  . Diabetes type 2, controlled (HCC) 03/02/2015  . Fall at home 03/02/2015  . Anemia 03/02/2015  . At high risk for falls 03/02/2015  . Acute blood loss anemia 09/01/2014  . NSTEMI (non-ST elevated myocardial infarction) (HCC) 08/30/2014  . Parkinson's disease (HCC) 10/21/2013  . Depression 10/21/2013  . Midline thoracic back pain 10/21/2013    Newell CoralRobertson, Denise Terry 07/07/2015, 1:03 PM  [facility] [address] [place], [place], [address] Phone: [phone]   Fax:  [phone]  Name: Brandon Carrillo MRN: [id number] Date of Birth: 02/18/1946    Newell Coralenise Terry Robertson, [place] [facility] 07/07/2015 1:03 PM Phone: [phone] Fax: [phone]

## 2015-07-08 ENCOUNTER — Ambulatory Visit: Payer: Self-pay | Admitting: Physical Therapy

## 2015-07-13 ENCOUNTER — Ambulatory Visit: Payer: Self-pay | Admitting: Physical Therapy

## 2015-07-13 DIAGNOSIS — R269 Unspecified abnormalities of gait and mobility: Secondary | ICD-10-CM

## 2015-07-13 MED FILL — CARBIDOPA-LEVODOPA 25-250 T: 25-250 | 30 days supply | Qty: 180 | Fill #3

## 2015-07-13 NOTE — Therapy (Signed)
[facility] [address] [place], [place], [address] Phone: [phone]   Fax:  [phone]  Physical Therapy Treatment  Patient Details  Name: Brandon Carrillo MRN: [id number] Date of Birth: 12/07/1945 Referring Provider: Kerin Salenebecca Tat, DO  Encounter Date: 07/13/2015      PT End of Session - 07/13/15 1306    Visit Number 4   Number of Visits 17   Date for PT Re-Evaluation 08/22/15   Authorization Type CAFA 100%   Authorization Time Period 01/19/15-07/19/15   PT Start Time 1106   PT Stop Time 1145   PT Time Calculation (min) 39 min   Equipment Utilized During Treatment Gait belt   Activity Tolerance Patient tolerated treatment well   Behavior During Therapy [facility] for tasks assessed/performed      Past Medical History  Diagnosis Date  . Depression   . Parkinson's disease [facility](HCC)     Past Surgical History  Procedure Laterality Date  . Leg surgery      There were no vitals filed for this visit.  Visit Diagnosis:  Abnormality of gait      Subjective Assessment - 07/13/15 1113    Subjective Denies changes or falls.     Patient is accompained by: Interpreter   Patient Stated Goals Pt's goal for physical therapy is to stay fit, continue to be functional to be able to do things for myself.   Currently in Pain? No/denies                         [facility] Treatment/Exercise - 07/13/15 1114    Transfers   Transfers Sit to Stand;Stand to Sit   Sit to Stand 5: Supervision   Sit to Stand Details Tactile cues for weight shifting;Verbal cues for technique  cues for forward weight shift   Five time sit to stand comments  10.91 seconds on 2nd attempt   Stand to Sit 5: Supervision   Number of Reps 10 reps;2 sets   Ambulation/Gait   Ambulation/Gait Yes   Ambulation/Gait Assistance 5: Supervision   Ambulation/Gait Assistance Details significant and frequent reminders/cues to increase step  length bil, bil heel strike and posture.   Ambulation Distance (Feet) 220 Feet  twice plus treadmill x 4 minutes   Assistive device None   Gait Pattern Step-through pattern;Decreased arm swing - right;Decreased arm swing - left;Decreased step length - right;Decreased step length - left;Trendelenburg;Lateral trunk lean to left;Decreased trunk rotation;Narrow base of support;Poor foot clearance - left;Poor foot clearance - right   Ambulation Surface Level;Indoor   High Level Balance   High Level Balance Activities Other (comment)   High Level Balance Comments stepping forward/backward across flush target in florr then across 3' foam with intermittent UE support   Knee/Hip Exercises: Aerobic   Stationary Bike Scifit level 2.0 all 4 extremities x 8 minutes with rpm>95                PT Education - 07/13/15 1305    Education provided Yes   Education Details Importance of continuing HEP and large amplitude stepping   Person(s) Educated Patient;Other (comment)  interpreter   Methods Explanation;Demonstration;Verbal cues;Tactile cues   Comprehension Verbalized understanding          PT Short Term Goals - 06/22/15 1618    PT SHORT TERM GOAL #1   Title Pt will perform HEP for improved balance, transfers, gait, decreased pain, independently.  TARGET 07/20/15   Time  4   Period Weeks   Status New   PT SHORT TERM GOAL #2   Title Pt will improve TUG score to less than or equal to 16 seconds for decreased fall risk.   Time 4   Period Weeks   Status New   PT SHORT TERM GOAL #3   Title Pt will improve 5x sit<>stand transfers to less than or equal to 18 seconds for improved transfer efficiency and safety.   Time 4   Period Weeks   Status New   PT SHORT TERM GOAL #4   Title Pt will verbalize/demonstrate understanding of body mechanics/posture and positioning with ADLs for decreased back pain.   Time 4   Period Weeks   Status New           PT Long Term Goals - 06/22/15 1620     PT LONG TERM GOAL #1   Title Pt will verbalize understanding of fall prevention within the home environment.  TARGET 08/22/15   Time 8   Period Weeks   Status New   PT LONG TERM GOAL #2   Title Pt will improve TUG score to less than or equal to 13.5 seconds for decreased fall risk.   Time 8   Period Weeks   Status New   PT LONG TERM GOAL #3   Title Pt will improve 5x sit<>stand to less than or equal to 15 seconds for improved efficiency and safety with transfers.   Time 8   Period Weeks   Status New   PT LONG TERM GOAL #4   Title Pt will improve turning 360 degrees to R and to L in <3 seconds for improved safety with turns.   Time 8   Period Weeks   Status New   PT LONG TERM GOAL #5   Title Pt will perform floor>stand transfer with UE support, modified independently, for improved fall recovery.   Time 8   Period Weeks   Status New               Plan - 07/13/15 1306    Clinical Impression Statement Pt appeared more fatigued today and needed frequent rest breaks during session.  Continues to need cues for large amplitude movements.  Continue PT per POC.   Pt will benefit from skilled therapeutic intervention in order to improve on the following deficits Abnormal gait;Decreased balance;Decreased mobility;Decreased coordination;Decreased strength;Difficulty walking;Postural dysfunction;Pain   Rehab Potential Good   PT Frequency 2x / week   PT Duration 8 weeks  plus evaluation   PT Treatment/Interventions ADLs/Self Care Home Management;Therapeutic exercise;Therapeutic activities;Functional mobility training;Gait training;Balance training;Neuromuscular re-education;Patient/family education   PT Next Visit Plan PWR! moves seated and possibly provide as HEP, weight shifting activities at counter, gait with intensity and amplitude   Consulted and Agree with Plan of Care Patient        Problem List Patient Active Problem List   Diagnosis Date Noted  . Diabetes type 2,  controlled (HCC) 03/02/2015  . Fall at home 03/02/2015  . Anemia 03/02/2015  . At high risk for falls 03/02/2015  . Acute blood loss anemia 09/01/2014  . NSTEMI (non-ST elevated myocardial infarction) (HCC) 08/30/2014  . Parkinson's disease (HCC) 10/21/2013  . Depression 10/21/2013  . Midline thoracic back pain 10/21/2013    Newell Coral 07/13/2015, 1:08 PM  [facility] [address] [place], [place], [address] Phone: [phone]   Fax:  [phone]  Name: Jkai Arwood MRN:  [id number] Date of Birth: 1945-08-22    Newell Coral, [facility] 07/13/2015 1:08 PM Phone: [phone] Fax: [phone]

## 2015-07-16 ENCOUNTER — Ambulatory Visit: Payer: Self-pay | Admitting: Physical Therapy

## 2015-07-16 DIAGNOSIS — R269 Unspecified abnormalities of gait and mobility: Secondary | ICD-10-CM

## 2015-07-16 DIAGNOSIS — R293 Abnormal posture: Secondary | ICD-10-CM

## 2015-07-16 NOTE — Therapy (Signed)
[place] [place] [address] [place], [place], [address] Phone: [phone]   Fax:  [phone]  Physical Therapy Treatment  Patient Details  Name: Brandon Carrillo MRN: [id number] Date of Birth: July 11, 1945 Referring Provider: Alonza Bogus, DO  Encounter Date: 07/16/2015      PT End of Session - 07/16/15 1742    Visit Number 5   Number of Visits 17   Date for PT Re-Evaluation 08/22/15   Authorization Type CAFA 100%   Authorization Time Period 01/19/15-07/19/15   PT Start Time 1019   PT Stop Time 1104   PT Time Calculation (min) 45 min   Activity Tolerance Patient tolerated treatment well   Behavior During Therapy [facility] for tasks assessed/performed      Past Medical History  Diagnosis Date  . Depression   . Parkinson's disease [facility])     Past Surgical History  Procedure Laterality Date  . Leg surgery      There were no vitals filed for this visit.  Visit Diagnosis:  Abnormal posture  Postural instability  Abnormality of gait      Subjective Assessment - 07/16/15 1022    Subjective Reports no changes or falls.  Reports doing his exercises twice daily.   Patient Stated Goals Pt's goal for physical therapy is to stay fit, continue to be functional to be able to do things for myself.   Currently in Pain? No/denies                         [facility] Adult PT Treatment/Exercise - 07/16/15 1030    Transfers   Transfers Sit to Stand;Stand to Sit   Sit to Stand 5: Supervision   Five time sit to stand comments  12.66   Stand to Sit 5: Supervision   Ambulation/Gait   Ambulation/Gait Yes   Ambulation/Gait Assistance 5: Supervision;6: Modified independent (Device/Increase time)   Ambulation/Gait Assistance Details PT provides cues to stop and reset posture and step length when steps become faster and shorter.  Pt able to self-correct with minimal cues by end of session.   Ambulation Distance (Feet)  220 Feet  then 120 x 2   Assistive device None   Gait Pattern Step-through pattern;Decreased arm swing - right;Decreased arm swing - left;Decreased step length - right;Decreased step length - left;Trendelenburg;Lateral trunk lean to left;Decreased trunk rotation;Narrow base of support;Poor foot clearance - left;Poor foot clearance - right  Improved step length, posture, arm swing with cues   Ambulation Surface Level;Indoor           PWR [facility]) - 07/16/15 1737    PWR! exercises Moves in sitting;Moves in standing  Attempted standing-pt difficulty sequencing   PWR! Up x 10  Cues for proper squat technique   PWR! Rock x 10  cues for slowed pacing of movement   PWR Step x 10  Cues for pacing, large/deliberate steps   Comments PWR! Moves in standing with chair in front for support as needed.   PWR! Up x 20   PWR! Rock x 10   PWR! Twist x 10   PWR! Step x 10   Comments PWR! Moves in sitting with visual, verbal cues, tactile cues for upright posture/scapular retraction in PWR! Up posture.             PT Education - 07/16/15 1740    Education provided Yes  With assistance of interpreter   Education Details Added PWR! MOves in sitting to  HEP; discussed progress towards goals and provided pt number to contact Financial assistance, as notes indicate his financial assistance runs through 07/19/15  Requested interpreter write directions in [place]; pt feels he can follow pictures on HEP   Person(s) Educated Patient  interpreter   Methods Explanation;Demonstration;Handout   Comprehension Verbalized understanding;Returned demonstration;Verbal cues required;Need further instruction          PT Short Term Goals - 07/16/15 1746    PT SHORT TERM GOAL #1   Title Pt will perform HEP for improved balance, transfers, gait, decreased pain, independently.  TARGET 07/20/15   Time 4   Period Weeks   Status Achieved   PT SHORT TERM GOAL #2   Title Pt will improve TUG score to less than or  equal to 16 seconds for decreased fall risk.   Time 4   Period Weeks   Status Achieved   PT SHORT TERM GOAL #3   Title Pt will improve 5x sit<>stand transfers to less than or equal to 18 seconds for improved transfer efficiency and safety.   Time 4   Period Weeks   Status Achieved   PT SHORT TERM GOAL #4   Title Pt will verbalize/demonstrate understanding of body mechanics/posture and positioning with ADLs for decreased back pain.   Baseline Reports exercises improve back pain   Time 4   Period Weeks   Status Achieved           PT Long Term Goals - 06/22/15 1620    PT LONG TERM GOAL #1   Title Pt will verbalize understanding of fall prevention within the home environment.  TARGET 08/22/15   Time 8   Period Weeks   Status New   PT LONG TERM GOAL #2   Title Pt will improve TUG score to less than or equal to 13.5 seconds for decreased fall risk.   Time 8   Period Weeks   Status New   PT LONG TERM GOAL #3   Title Pt will improve 5x sit<>stand to less than or equal to 15 seconds for improved efficiency and safety with transfers.   Time 8   Period Weeks   Status New   PT LONG TERM GOAL #4   Title Pt will improve turning 360 degrees to R and to L in <3 seconds for improved safety with turns.   Time 8   Period Weeks   Status New   PT LONG TERM GOAL #5   Title Pt will perform floor>stand transfer with UE support, modified independently, for improved fall recovery.   Time 8   Period Weeks   Status New               Plan - 07/16/15 1743    Clinical Impression Statement Pt has met all STGs.  Continues to need frequent cues for large amplitude movements, but appears motivated and responds well to cues during therapy.  Question carryover for home.  Would like to continue POC towards LTGs to further address balance, posture, strength and gait.   Pt will benefit from skilled therapeutic intervention in order to improve on the following deficits Abnormal gait;Decreased  balance;Decreased mobility;Decreased coordination;Decreased strength;Difficulty walking;Postural dysfunction;Pain   Rehab Potential Good   PT Frequency 2x / week   PT Duration 8 weeks  plus evaluation   PT Treatment/Interventions ADLs/Self Care Home Management;Therapeutic exercise;Therapeutic activities;Functional mobility training;Gait training;Balance training;Neuromuscular re-education;Patient/family education   PT Next Visit Plan Review PWR! MOves seated HEP; continue to work on posture,  gait; will likely need to schedule more PT visits   Consulted and Agree with Plan of Care Patient        Problem List Patient Active Problem List   Diagnosis Date Noted  . Diabetes type 2, controlled ([place]) 03/02/2015  . Fall at home 03/02/2015  . Anemia 03/02/2015  . At high risk for falls 03/02/2015  . Acute blood loss anemia 09/01/2014  . NSTEMI (non-ST elevated myocardial infarction) ([place]) 08/30/2014  . Parkinson's disease ([place]) 10/21/2013  . Depression 10/21/2013  . Midline thoracic back pain 10/21/2013    MARRIOTT,AMY W. 07/16/2015, 5:49 PM Frazier Butt., PT [facility] [address] [place], [place], [address] Phone: [phone]   Fax:  [phone]  Name: Dmitri Pettigrew MRN: [id number] Date of Birth: Sep 17, 1945

## 2015-07-16 NOTE — Patient Instructions (Signed)
  To re-apply for financial assistance through [facility]:   Contact:  [phone] with pro-fee billing dept listed on his application that he may contact

## 2015-07-20 ENCOUNTER — Ambulatory Visit: Payer: Self-pay | Admitting: Physical Therapy

## 2015-07-22 ENCOUNTER — Ambulatory Visit: Payer: Self-pay | Admitting: Physical Therapy

## 2015-07-28 ENCOUNTER — Ambulatory Visit (INDEPENDENT_AMBULATORY_CARE_PROVIDER_SITE_OTHER): Payer: Self-pay | Admitting: Neurology

## 2015-07-28 ENCOUNTER — Encounter: Payer: Self-pay | Admitting: Neurology

## 2015-07-28 ENCOUNTER — Telehealth: Payer: Self-pay | Admitting: Neurology

## 2015-07-28 VITALS — BP 124/60 | HR 57 | Ht 66.0 in | Wt 157.0 lb

## 2015-07-28 DIAGNOSIS — G20 Parkinson's disease: Secondary | ICD-10-CM

## 2015-07-28 MED ORDER — CARBIDOPA-LEVODOPA 25-250 MG PO TABS: 2.0000 | ORAL_TABLET | Freq: Four times a day (QID) | ORAL | Status: DC

## 2015-07-28 MED FILL — CARBIDOPA-LEVODOPA 25-250 T: 25-250 | 30 days supply | Qty: 240 | Fill #0

## 2015-07-28 NOTE — Progress Notes (Signed)
Brandon Carrillo was seen today in the movement disorders clinic for neurologic consultation at the request of Dr. Doreene Burke.  The consultation is for the evaluation of PD.  The patient is accompanied by a friend, who supplements the history.  His friend also helps him at home and with bringing him to appoitments.  There is also a Forensic scientist present.  I reviewed the limited medical records that are available.  The first symptom(s) the patient noticed was and that was 8 years ago.   First sx was tremor of the R hand/arm.  Still has no tremor elsewhere.  It was diagnosed in [place] but has never seen a neurologist.  He is on carbidopa/levodopa 25/250, 2 at 6 am, 2 at 2 pm and 2 at 10 pm now but started on one tablet tid originally.  He actually goes to bed at 8 pm but will awaken at 10 pm just to take the 10 pm dose of medication.  He notes wearing off of the medication at least 1 hour before the dosage.  It takes about 1 hour for the first morning pill to work.    01/06/14 update:  Patient is following up today.  A medical translator is present as is his friend, who supplements the history.  He is currently on carbidopa/levodopa 25/250, 2 tablets at 6 AM/11 AM/3 PM and carbidopa/levodopa 50/200 was added at 8 PM.  We referred him to the Parkinson's disease program at the neuro rehabilitation Center.  He states that he is markedly better.  He has some tremor but stiffness is much better.  No falls.  Medication doesn't wear off until it is time for the next dosage.  Exercising some at home.  Off of antidepressants but mood is much better.  No hallucinations.  No near sycope.    01/19/15 update:  The patient is following up today.  Medical translator is present.  I have not seen the patient in just over 1 year.  He has canceled or no showed several appointments.  He remained on carbidopa/levodopa 25/250, 2 tablets at 6 AM/11 AM/3 PM and carbidopa/levodopa 50/200 at bedtime until 3 days ago when he  ran out of medication.  I reviewed records available to me since last visit.  He sustained a fall onto a glass table on 08/30/2014 and had a large laceration to the right ear.  It was estimated that he lost 1-2 quarts of blood.  During the episode, he began to have chest pain and his troponins trended upward.  Cardiology saw him and felt that this represented demand ischemia.  He was admitted and monitored.  During that hospitalization, his hemoglobin A1c was 6.8.  He was started on metformin but admits he isn't taking that.  He isn't taking any of his other medications, including his lipitor or his BP meds.   He admits to only one other fall.  He states that when is taking his medication for PD he doesn't shake.    02/18/15 update:  The patient is following up today.  Accompanied by church member who supplements hx.  Medical translator present. He has a history of Parkinson's disease.  Last visit, he did not look well because he had run out of his medications.  I refilled his medication last visit.  He is now taking carbidopa/levodopa 25/250, 2 tablets 3 times a day in addition to carbidopa/levodopa 50/200 at night.  This is a total daily levodopa dose of 1800 mg per day.  Off  his DM medications.  States that his daughter threw them at him and he lost them.  Is c/o constipation.  No falls.  No hallucinations.  Is doing PT exercises at home.  More difficult for him with transportation because church member that used to bring him to appts died.    May 21, 2015 update:  The patient is following up today, accompanied by both a church member (who supplements the history) as well as a Orthoptist.  He is on carbidopa/levodopa 25/250, 2 tablets at 6 AM/11 AM/3 PM and then carbidopa/levodopa 50/200 at 8 PM.  I started him on pramipexole 0.5 mg 3 times a day last visit but he is only taking it one time a day (instructions on bottle are in english but instructions on AVS last visit were written out in spanish).  He was  also referred for physical therapy, although it turns out that the rehab unit never contacted him.  I did call them yesterday about this and they were to call him immediately.  He did fall in early November.  He apparently tripped over his slippers.  He did not get hurt and he did follow up with his primary care physician.  No new medications were administered.  It was felt that his diabetes is under good control and no medications were needed for that.  07/28/15 update:  The patient is following up today, accompanied by both a church member (who supplements the history) as well as a Orthoptist.  He is on carbidopa/levodopa 25/250, 2 tablets at 6 AM/10 AM/2pm/6 PM (was only supposed to be on 6 per day but taking 8 per day and admits he sometimes adds 1-2 in the middle of the night)  and then carbidopa/levodopa 50/200 at 8 PM.  I had previously started him on pramipexole 0.5 mg, but he was only taking it once a day and last visit I again told him to increase it to 3 times a day.  He did that without any problems.  He has been attending physical therapy at the rehabilitation unit.  He denies any lightheadedness or near syncope.  No hallucinations.  No falls.  Sleeping well.  Had social services at his house and they evaluated his heat/housing situation and told him that they would be back in the summer as they were worried about the heat.  Told son that he needed handrail in the bathroom.    PREVIOUS MEDICATIONS: Sinemet; mirapex  ALLERGIES:  No Known Allergies  CURRENT MEDICATIONS:  Current Outpatient Prescriptions on File Prior to Visit  Medication Sig Dispense Refill  . carbidopa-levodopa (SINEMET CR) 50-200 MG tablet Take 1 tablet by mouth at bedtime. 30 tablet 3  . pramipexole (MIRAPEX) 0.5 MG tablet Take 1 tablet (0.5 mg total) by mouth 3 (three) times daily. 90 tablet 3   No current facility-administered medications on file prior to visit.    PAST MEDICAL HISTORY:   Past Medical History    Diagnosis Date  . Depression   . Parkinson's disease (HCC)     PAST SURGICAL HISTORY:   Past Surgical History  Procedure Laterality Date  . Leg surgery      SOCIAL HISTORY:   Social History   Social History  . Marital Status: Widowed    Spouse Name: N/A  . Number of Children: N/A  . Years of Education: N/A   Occupational History  . Not on file.   Social History Main Topics  . Smoking status: Former Games developer  . Smokeless  tobacco: Not on file     Comment: as a teenager  . Alcohol Use: No  . Drug Use: No  . Sexual Activity: Yes   Other Topics Concern  . Not on file   Social History Narrative    FAMILY HISTORY:   Family Status  Relation Status Death Age  . Mother Deceased     diabetes  . Father Deceased     "old age"  . Brother Alive     healthy  . Brother Alive     healthy  . Son Alive     healthy  . Son Alive     healthy  . Son Alive     healthy  . Son Alive     healthy  . Daughter Alive     healthy  . Daughter Alive     healthy    ROS:  A complete 10 system review of systems was obtained and was unremarkable apart from what is mentioned above.  PHYSICAL EXAMINATION:    VITALS:   Filed Vitals:   07/28/15 1001  BP: 124/60  Pulse: 57  Height:  (1.676 m)  Weight: 157 lb (71.215 kg)    GEN:  The patient appears stated age and is in NAD.  Smiles and laughs today.  Makes jokes. HEENT:  Normocephalic, atraumatic.  The mucous membranes are moist. The superficial temporal arteries are without ropiness or tenderness. CV:  RRR Lungs:  CTAB Neck/HEME:  There are no carotid bruits bilaterally.  Neurological examination:  Orientation: The patient is alert and oriented x3. Cranial nerves: There is good facial symmetry.  There is facial hypomimia.   The visual fields are full to confrontational testing.  The patient speaks Spanish and this examiner does not, so I cannot assess whether the speech is fluent/clear, but it does appear to be and the  translator reports it as so.  It is slightly hypophonic. Soft palate rises symmetrically and there is no tongue deviation. Hearing is intact to conversational tone. Sensation: Sensation is intact to light touch throughout Motor: Strength is at least antigravity x 4.   Movement examination: Tone: There is no rigidity in the UE today Abnormal movements: There is rare tremor and independent in the RUE and LUE Coordination:  There is mild decremation today with all forms of RAMs bilaterally, L more than right Gait and Station: The patient can arise without using the hands.  He shuffles mildly and has a festinating gait with stooped posture.  ASSESSMENT/PLAN:  1.  Idiopathic Parkinson's disease.    -Continue carbidopa/levodopa 25/250, 2 tablets at 6 AM/10 AM/2 PM/6pm and 50/200 at 8 PM.  Told him in future to not increase dose without calling me and letting me know.    -I talked to him today about the importance of continued exercise.    -continue mirapex 0.5 mg tid.  No evidence of compulsive behaviors.  Risks, benefits, side effects and alternative therapies were discussed.  The opportunity to ask questions was given and they were answered to the best of my ability.  The patient expressed understanding and willingness to follow the outlined treatment protocols.  -talked to him about DBS today.  Neuropsych testing will be challenging with him due to the fact that there is no one here (closest [place]) who speaks spanish who can do this test.  He cannot travel there and even if he could, finances would be an issue as he is on [employer] assist.  Pt and church  friend asked multiple questions re: DBS.    -will benefit from Child psychotherapistsocial worker.  Will have those services available in our office at end of July.    -will see if he qualifies for meals on wheels 2.  DM  -controlled without medication  3.  Constipation  -copy of rancho recipe given  4.  Follow up is anticipated in the next few months,  sooner should new neurologic issues arise.  Much greater than 50% of this visit was spent in counseling with the patient.  Total face to face time:  40 min

## 2015-07-28 NOTE — Telephone Encounter (Signed)
Great!  Do they know he speaks only spanish and they may need to contact his church member?

## 2015-07-28 NOTE — Telephone Encounter (Signed)
Spoke with Richard at [employer]. He took the patient's information and states that someone will be in touch with the patient within 3 business days. They will have a Child psychotherapistsocial worker do an assessment for need and go from there.

## 2015-07-29 NOTE — Telephone Encounter (Signed)
Yes, they are aware he only speaks spanish.

## 2015-08-03 ENCOUNTER — Ambulatory Visit: Payer: Self-pay | Attending: Family Medicine

## 2015-08-03 MED FILL — CARBIDOPA-LEVO ER 50-200 TA: 50-200 | 30 days supply | Qty: 30 | Fill #2

## 2015-08-03 MED FILL — PRAMIPEXOLE 0.5 MG TABLET: 0.5 | 30 days supply | Qty: 90 | Fill #3

## 2015-08-09 ENCOUNTER — Ambulatory Visit: Payer: Self-pay

## 2015-08-20 ENCOUNTER — Ambulatory Visit: Payer: Self-pay

## 2015-08-24 MED FILL — CARBIDOPA-LEVODOPA 25-250 T: 25-250 | 30 days supply | Qty: 240 | Fill #1

## 2015-09-01 ENCOUNTER — Ambulatory Visit: Payer: Self-pay | Attending: Family Medicine

## 2015-09-16 ENCOUNTER — Encounter: Payer: Self-pay | Admitting: Family Medicine

## 2015-09-16 ENCOUNTER — Ambulatory Visit: Payer: Self-pay | Attending: Family Medicine | Admitting: Family Medicine

## 2015-09-16 ENCOUNTER — Other Ambulatory Visit: Payer: Self-pay | Admitting: Neurology

## 2015-09-16 VITALS — BP 111/57 | HR 59 | Temp 98.4°F | Resp 16 | Ht 66.0 in | Wt 162.0 lb

## 2015-09-16 DIAGNOSIS — R5383 Other fatigue: Secondary | ICD-10-CM | POA: Insufficient documentation

## 2015-09-16 DIAGNOSIS — F329 Major depressive disorder, single episode, unspecified: Secondary | ICD-10-CM | POA: Insufficient documentation

## 2015-09-16 DIAGNOSIS — K0889 Other specified disorders of teeth and supporting structures: Secondary | ICD-10-CM | POA: Insufficient documentation

## 2015-09-16 DIAGNOSIS — K5900 Constipation, unspecified: Secondary | ICD-10-CM | POA: Insufficient documentation

## 2015-09-16 DIAGNOSIS — E119 Type 2 diabetes mellitus without complications: Secondary | ICD-10-CM | POA: Insufficient documentation

## 2015-09-16 DIAGNOSIS — B351 Tinea unguium: Secondary | ICD-10-CM | POA: Insufficient documentation

## 2015-09-16 DIAGNOSIS — Z87891 Personal history of nicotine dependence: Secondary | ICD-10-CM | POA: Insufficient documentation

## 2015-09-16 DIAGNOSIS — E559 Vitamin D deficiency, unspecified: Secondary | ICD-10-CM | POA: Insufficient documentation

## 2015-09-16 DIAGNOSIS — Z0000 Encounter for general adult medical examination without abnormal findings: Secondary | ICD-10-CM

## 2015-09-16 DIAGNOSIS — M25561 Pain in right knee: Secondary | ICD-10-CM | POA: Insufficient documentation

## 2015-09-16 DIAGNOSIS — M25562 Pain in left knee: Secondary | ICD-10-CM | POA: Insufficient documentation

## 2015-09-16 DIAGNOSIS — Z79899 Other long term (current) drug therapy: Secondary | ICD-10-CM | POA: Insufficient documentation

## 2015-09-16 DIAGNOSIS — F32A Depression, unspecified: Secondary | ICD-10-CM

## 2015-09-16 LAB — COMPLETE METABOLIC PANEL WITH GFR
ALBUMIN: 3.6 g/dL (ref 3.6–5.1)
ALK PHOS: 79 U/L (ref 40–115)
ALT: 5 U/L — ABNORMAL LOW (ref 9–46)
AST: 15 U/L (ref 10–35)
BUN: 13 mg/dL (ref 7–25)
CALCIUM: 8.8 mg/dL (ref 8.6–10.3)
CO2: 21 mmol/L (ref 20–31)
Chloride: 105 mmol/L (ref 98–110)
Creat: 0.6 mg/dL — ABNORMAL LOW (ref 0.70–1.25)
GFR, Est African American: >89 mL/min (ref >=60)
GLUCOSE: 105 mg/dL — AB (ref 65–99)
POTASSIUM: 4.2 mmol/L (ref 3.5–5.3)
SODIUM: 136 mmol/L (ref 135–146)
Total Bilirubin: 0.4 mg/dL (ref 0.2–1.2)
Total Protein: 7.3 g/dL (ref 6.1–8.1)

## 2015-09-16 LAB — HEMOCCULT GUIAC POC 1CARD (OFFICE): FECAL OCCULT BLD: NEGATIVE

## 2015-09-16 LAB — LIPID PANEL
Cholesterol: 149 mg/dL (ref 125–200)
HDL: 46 mg/dL (ref >=40)
LDL CALC: 94 mg/dL (ref <130)
TRIGLYCERIDES: 46 mg/dL (ref <150)
Total CHOL/HDL Ratio: 3.2 Ratio (ref <=5.0)
VLDL: 9 mg/dL (ref <30)

## 2015-09-16 LAB — HEMOGLOBIN A1C
Hgb A1c MFr Bld: 6.5 % — ABNORMAL HIGH (ref <5.7)
Mean Plasma Glucose: 140 mg/dL

## 2015-09-16 LAB — CBC
HEMATOCRIT: 39.8 % (ref 38.5–50.0)
Hemoglobin: 13.2 g/dL (ref 13.2–17.1)
MCH: 30.5 pg (ref 27.0–33.0)
MCHC: 33.2 g/dL (ref 32.0–36.0)
MCV: 91.9 fL (ref 80.0–100.0)
MPV: 8.7 fL (ref 7.5–12.5)
Platelets: 288 10*3/uL (ref 140–400)
RBC: 4.33 MIL/uL (ref 4.20–5.80)
RDW: 14.1 % (ref 11.0–15.0)
WBC: 8.4 10*3/uL (ref 3.8–10.8)

## 2015-09-16 LAB — VITAMIN B12: VITAMIN B 12: 438 pg/mL (ref 200–1100)

## 2015-09-16 MED ORDER — ONE-A-DAY MENS 50+ ADVANTAGE PO TABS: 1.0000 | ORAL_TABLET | Freq: Every day | ORAL | Status: DC

## 2015-09-16 MED ORDER — POLYETHYLENE GLYCOL 3350 17 GM/SCOOP PO POWD: 17.0000 g | Freq: Every day | ORAL | Status: DC | PRN

## 2015-09-16 MED ORDER — ACETAMINOPHEN ER 650 MG PO TBCR: 650.0000 mg | EXTENDED_RELEASE_TABLET | Freq: Three times a day (TID) | ORAL | Status: DC | PRN

## 2015-09-16 MED ORDER — TERBINAFINE HCL 250 MG PO TABS: 250.0000 mg | ORAL_TABLET | Freq: Every day | ORAL | Status: DC

## 2015-09-16 MED FILL — PRAMIPEXOLE 0.5 MG TABLET: 0.5 | 30 days supply | Qty: 90 | Fill #0

## 2015-09-16 MED FILL — POLYETHYLENE GLYCOL 3350: 28 days supply | Qty: 510 | Fill #0

## 2015-09-16 MED FILL — TERBINAFINE HCL 250 MG TAB: 250 | 30 days supply | Qty: 30 | Fill #0

## 2015-09-16 MED FILL — CARBIDOPA-LEVO ER 50-200 TA: 50-200 | 30 days supply | Qty: 30 | Fill #3

## 2015-09-16 NOTE — Progress Notes (Signed)
F/U Depression  No pain today  No tobacco user  No suicidal thoughts in the past two weeks  Requesting Dental referral

## 2015-09-16 NOTE — Telephone Encounter (Signed)
Mirapex refill requested. Per last office note- patient to remain on medication. Refill approved and sent to patient's pharmacy.   

## 2015-09-16 NOTE — Patient Instructions (Addendum)
Brandon Carrillo was seen today for depression.  Diagnoses and all orders for this visit:  Pain, dental -     Ambulatory referral to Dentistry -     Hemoccult - 1 Card (office)  Controlled type 2 diabetes mellitus without complication, without long-term current use of insulin (HCC) -     Hemoglobin A1c -     Ambulatory referral to Ophthalmology -     COMPLETE METABOLIC PANEL WITH GFR -     CBC -     Lipid Panel  Onychomycosis of toenail -     terbinafine (LAMISIL) 250 MG tablet; Take 1 tablet (250 mg total) by mouth daily. -     Ambulatory referral to Podiatry  Constipation, unspecified constipation type -     polyethylene glycol powder (GLYCOLAX/MIRALAX) powder; Take 17 g by mouth daily as needed for mild constipation.  Other fatigue -     Vitamin B12 -     Vitamin D, 25-hydroxy -     Multiple Vitamins-Minerals (ONE-A-DAY MENS 50+ ADVANTAGE) TABS; Take 1 tablet by mouth daily.  Bilateral knee pain -     acetaminophen (TYLENOL 8 HOUR) 650 MG CR tablet; Take 1 tablet (650 mg total) by mouth every 8 (eight) hours as needed for pain.    F/u in 3 months for diabetes   Colonoscopa (Colonoscopy) Una colonoscopa es un examen para evaluar el colon. Este examen ayuda a encontrar bultos (tumores), masas (plipos), hemorragias y enrojecimiento e hinchazn (inflamacin) en el colon.  ANTES DEL PROCEDIMIENTO  Consulte a su mdico si debe cambiar o suspender los medicamentos que toma habitualmente.  Es posible que tenga que tomar una gran cantidad de un lquido especial (preparacin del colon por va oral). Debe comenzar a Games developertomarlo el da anterior al procedimiento. El lquido har que evace material fecal (heces) lquida. De esta manera, se limpiar el colon.  No coma ni beba nada ms una vez que haya comenzado con la preparacin del colon, a menos que el mdico le indique que es seguro Phelpshacerlo.  Haga arreglos para que alguien lo lleve a su casa despus del procedimiento. PROCEDIMIENTO  Le  administrarn un medicamento para ayudarlo a relajarse (sedante).  Se recostar de costado con las rodillas flexionadas.  Le insertarn un tubo con una cmara en el extremo a travs del orificio anal (recto) y dentro del colon. Se envan imgenes a la pantalla de una computadora. Su mdico detectar si tiene alguna anormalidad.  Es posible que el mdico tome una muestra de tejido (biopsia) del colon para estudiarlo ms atentamente.  El procedimiento finaliza cuando el mdico examina todo el colon. DESPUS DEL PROCEDIMIENTO  No conduzca vehculos durante las 24horas posteriores al examen.  Es posible que encuentre una pequea cantidad de sangre en la materia fecal. Esto es normal.  Podr despedir gases y Warehouse managertener clicos en el estmago (abdominales). Esto es normal.  Pregunte cuando estarn listos los [place] Tool Worksresultados de los estudios. Asegrese de Starbucks Corporationobtener los resultados.   Esta informacin no tiene Theme park managercomo fin reemplazar el consejo del mdico. Asegrese de hacerle al mdico cualquier pregunta que tenga.   Document Released: 07/26/2010 Document Revised: 04/15/2013 Elsevier Interactive Patient Education [employer]2016 Elsevier Inc.

## 2015-09-16 NOTE — Progress Notes (Signed)
Patient ID: Brandon Carrillo, male   DOB: 08-Feb-1946, 70 y.o.   MRN: [id number]   Subjective:  Patient ID: Brandon Carrillo, male    DOB: Aug 12, 1945  Age: 70 y.o. MRN: [id number]  Spanish interpreter used, telephone interpreter # [phone]  CC: Depression and Diabetes   HPI Brandon Carrillo presents for fall and diabetes. He has parkinson's disease with depression. He is followed closely by his neurologist. He comes today with a close friend from church.    1. Depression: in setting of parkinson's diease. Improved. He exercises and walks to avoid thinking about his friend who passed away last fall. No SI. He eats and sleeps well.  2. Constipation: hard stool. No blood in stool. Passing stool is painful sometimes.   3. DM2: diet controlled. Not checking CBGs. No subjective low CBGs. Foot exam done today. Due to eye exam. Has some blurry vision that comes and goes.  4. Parkinson's disease: compliant with sinemet and maripex. No recent falls. Walks unassisted.   5. Dental pain: L upper molar pain. No swelling or fever. Pain x 1 month. He does not have a dental home.   Social History  Substance Use Topics  . Smoking status: Former Games developer  . Smokeless tobacco: Not on file     Comment: as a teenager  . Alcohol Use: No    Outpatient Prescriptions Prior to Visit  Medication Sig Dispense Refill  . carbidopa-levodopa (SINEMET CR) 50-200 MG tablet Take 1 tablet by mouth at bedtime. 30 tablet 3  . carbidopa-levodopa (SINEMET IR) 25-250 MG tablet Take 2 tablets by mouth 4 (four) times daily. 360 tablet 1  . pramipexole (MIRAPEX) 0.5 MG tablet Take 1 tablet (0.5 mg total) by mouth 3 (three) times daily. 90 tablet 3   No facility-administered medications prior to visit.    ROS Review of Systems  Constitutional: Positive for fatigue. Negative for fever, chills and unexpected weight change.  HENT: Positive for dental problem.   Eyes: Negative for visual disturbance.  Respiratory:  Negative for cough and shortness of breath.   Cardiovascular: Negative for chest pain, palpitations and leg swelling.  Gastrointestinal: Positive for abdominal pain, constipation and rectal pain. Negative for nausea, vomiting, diarrhea, blood in stool, abdominal distention and anal bleeding.  Endocrine: Negative for polydipsia, polyphagia and polyuria.  Musculoskeletal: Positive for arthralgias (knees ) and neck pain. Negative for myalgias, back pain and gait problem.  Skin: Negative for rash.  Allergic/Immunologic: Negative for immunocompromised state.  Neurological: Positive for tremors.  Hematological: Negative for adenopathy. Does not bruise/bleed easily.  Psychiatric/Behavioral: Positive for dysphoric mood. Negative for suicidal ideas and sleep disturbance. The patient is not nervous/anxious.     Objective:  BP 111/57 mmHg  Pulse 59  Temp(Src) 98.4 F (36.9 C) (Oral)  Resp 16  Ht  (1.676 m)  Wt 162 lb (73.483 kg)  BMI 26.16 kg/m2  SpO2 97%  BP/Weight 09/16/2015 07/28/2015 04/29/2015  Systolic BP 111 124 110  Diastolic BP 57 60 64  Wt. (Lbs) 162 157 164  BMI 26.16 25.35 26.48   Physical Exam  Constitutional: He appears well-developed and well-nourished. No distress.  HENT:  Head: Normocephalic and atraumatic.  Mouth/Throat: No dental abscesses.    Neck: Normal range of motion. Neck supple.  Cardiovascular: Normal rate, regular rhythm, normal heart sounds and intact distal pulses.   Pulmonary/Chest: Effort normal and breath sounds normal.  Abdominal: Soft. Bowel sounds are normal. He exhibits no distension and no mass. There is tenderness in the  suprapubic area. There is no rebound and no guarding.  Genitourinary: Rectal exam shows tenderness. Rectal exam shows no external hemorrhoid, no internal hemorrhoid, no fissure, no mass and anal tone normal. Guaiac negative stool.  Hard stool in rectal vault   Musculoskeletal: He exhibits no edema.  Neurological: He is alert. He  displays tremor. Gait abnormal.  Shuffling gait Pill rolling resting tremor Intention tremor in hand and face   Skin: Skin is warm and dry. No rash noted. No erythema.     Psychiatric: He exhibits a depressed mood.   [facility] 09/16/2015 03/02/2015 10/21/2013  Decreased Interest 0 1 0  Down, Depressed, Hopeless 0 1 1  PHQ - 2 Score 0 2 1  Altered sleeping 0 1 -  Tired, decreased energy 2 2 -  Change in appetite 0 0 -  Feeling bad or failure about yourself  0 0 -  Trouble concentrating 0 0 -  Moving slowly or fidgety/restless 0 2 -  Suicidal thoughts 0 2 -  PHQ-9 Score 2 9 -    Lab Results  Component Value Date   HGBA1C 6.0 03/02/2015   Lab Results  Component Value Date   HGBA1C 6.5* 09/16/2015    CBG 103  Assessment & Plan:   Problem List Items Addressed This Visit    Vitamin D deficiency   Relevant Medications   Vitamin D, Ergocalciferol, (DRISDOL) 50000 units CAPS capsule   Pain, dental - Primary   Relevant Orders   Ambulatory referral to Dentistry   Hemoccult - 1 Card (office) (Completed)   Other fatigue   Relevant Medications   Multiple Vitamins-Minerals (ONE-A-DAY MENS 50+ ADVANTAGE) TABS   Other Relevant Orders   Vitamin B12 (Completed)   Vitamin D, 25-hydroxy (Completed)   Onychomycosis of toenail   Relevant Medications   terbinafine (LAMISIL) 250 MG tablet   Other Relevant Orders   Ambulatory referral to Podiatry   Diabetes type 2, controlled (HCC) (Chronic)    Still well controlled but A1c is elevated Add metformin 500 mg XR once daily        Relevant Medications   metFORMIN (GLUCOPHAGE XR) 500 MG 24 hr tablet   atorvastatin (LIPITOR) 40 MG tablet   Other Relevant Orders   Hemoglobin A1c (Completed)   Ambulatory referral to Ophthalmology   COMPLETE METABOLIC PANEL WITH GFR (Completed)   CBC (Completed)   Lipid Panel (Completed)   Depression (Chronic)    Improved with low screen Plan to continue to screen q visit        Constipation   Relevant Medications   polyethylene glycol powder (GLYCOLAX/MIRALAX) powder   Bilateral knee pain   Relevant Medications   acetaminophen (TYLENOL 8 HOUR) 650 MG CR tablet    Other Visit Diagnoses    Healthcare maintenance        Relevant Orders    Ambulatory referral to Gastroenterology       No orders of the defined types were placed in this encounter.    Follow-up: No Follow-up on file.   Dessa PhiJosalyn Iven Earnhart MD

## 2015-09-17 DIAGNOSIS — K5900 Constipation, unspecified: Secondary | ICD-10-CM | POA: Insufficient documentation

## 2015-09-17 DIAGNOSIS — R5383 Other fatigue: Secondary | ICD-10-CM | POA: Insufficient documentation

## 2015-09-17 DIAGNOSIS — M25562 Pain in left knee: Secondary | ICD-10-CM

## 2015-09-17 DIAGNOSIS — E559 Vitamin D deficiency, unspecified: Secondary | ICD-10-CM | POA: Insufficient documentation

## 2015-09-17 DIAGNOSIS — B351 Tinea unguium: Secondary | ICD-10-CM | POA: Insufficient documentation

## 2015-09-17 DIAGNOSIS — M25561 Pain in right knee: Secondary | ICD-10-CM | POA: Insufficient documentation

## 2015-09-17 LAB — VITAMIN D 25 HYDROXY (VIT D DEFICIENCY, FRACTURES): VIT D 25 HYDROXY: 16 ng/mL — AB (ref 30–100)

## 2015-09-17 MED ORDER — VITAMIN D (ERGOCALCIFEROL) 1.25 MG (50000 UNIT) PO CAPS: 50000.0000 [IU] | ORAL_CAPSULE | ORAL | Status: DC

## 2015-09-17 MED ORDER — METFORMIN HCL ER 500 MG PO TB24: 500.0000 mg | ORAL_TABLET | Freq: Every day | ORAL | Status: DC

## 2015-09-17 MED ORDER — ATORVASTATIN CALCIUM 40 MG PO TABS: 40.0000 mg | ORAL_TABLET | Freq: Every day | ORAL | Status: DC

## 2015-09-17 NOTE — Assessment & Plan Note (Addendum)
Still well controlled but A1c is elevated Add metformin 500 mg XR once daily

## 2015-09-17 NOTE — Assessment & Plan Note (Signed)
Improved with low screen Plan to continue to screen q visit

## 2015-09-24 ENCOUNTER — Other Ambulatory Visit: Payer: Self-pay | Admitting: Neurology

## 2015-09-24 MED FILL — CARBIDOPA-LEVODOPA 25-250 T: 25-250 | 30 days supply | Qty: 240 | Fill #2

## 2015-09-27 ENCOUNTER — Telehealth: Payer: Self-pay | Admitting: *Deleted

## 2015-09-27 NOTE — Telephone Encounter (Signed)
-----   Message from Dessa PhiJosalyn Funches, MD sent at 09/17/2015  3:57 PM EDT ----- Vit D deficiency, 8 week replacement ordered A1c elevated, metformin ordered Added lipitor 40 mg daily due to diabetes and hx of CAD All other labs normal

## 2015-09-27 NOTE — Telephone Encounter (Signed)
Date of birth verified by pt Vit D deficiency, A1c elevated  All other labs normal  Rx Vit D send to [facility]  Metformin and Lipitor Rx send to [facility] pharmacy  Pt verbalized understanding  Information given in Spanish

## 2015-10-20 MED FILL — TERBINAFINE HCL 250 MG TAB: 250 | 30 days supply | Qty: 30 | Fill #1

## 2015-10-20 MED FILL — VIT D2 1.25 MG (50,000 UNIT: 1.25 MG | 56 days supply | Qty: 8 | Fill #0

## 2015-10-20 MED FILL — CARBIDOPA-LEVODOPA 25-250 T: 25-250 | 30 days supply | Qty: 180 | Fill #0

## 2015-10-20 MED FILL — POLYETHYLENE GLYCOL 3350: 28 days supply | Qty: 510 | Fill #1

## 2015-10-20 MED FILL — METFORMIN HCL ER 500 MG TAB: 500 | 30 days supply | Qty: 30 | Fill #0

## 2015-10-20 MED FILL — PRAMIPEXOLE 0.5 MG TABLET: 0.5 | 30 days supply | Qty: 90 | Fill #1

## 2015-10-21 MED FILL — ?ATORVASTATIN 40MG TABLET: 40 | 30 days supply | Qty: 30 | Fill #0

## 2015-11-11 MED FILL — CARBIDOPA-LEVODOPA 25-250 T: 25-250 | 30 days supply | Qty: 180 | Fill #1
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
## 2015-11-29 ENCOUNTER — Other Ambulatory Visit: Payer: Self-pay | Admitting: Family Medicine

## 2015-11-29 ENCOUNTER — Ambulatory Visit: Payer: Self-pay | Attending: Podiatry

## 2015-11-29 ENCOUNTER — Encounter: Payer: Self-pay | Admitting: Clinical

## 2015-11-29 DIAGNOSIS — E559 Vitamin D deficiency, unspecified: Secondary | ICD-10-CM

## 2015-11-29 MED FILL — METFORMIN HCL ER 500 MG TAB: 500 | 30 days supply | Qty: 30 | Fill #1

## 2015-11-29 MED FILL — TERBINAFINE HCL 250 MG TAB: 250 | 30 days supply | Qty: 30 | Fill #2

## 2015-11-29 MED FILL — CARBIDOPA-LEVO ER 50-200 TA: 50-200 | 30 days supply | Qty: 30 | Fill #0

## 2015-11-29 MED FILL — ?ATORVASTATIN 40MG TABLET: 40 | 30 days supply | Qty: 30 | Fill #1

## 2015-11-29 MED FILL — PRAMIPEXOLE 0.5 MG TABLET: 0.5 | 30 days supply | Qty: 90 | Fill #2

## 2015-11-30 ENCOUNTER — Encounter: Payer: Self-pay | Admitting: Neurology

## 2015-11-30 ENCOUNTER — Ambulatory Visit (INDEPENDENT_AMBULATORY_CARE_PROVIDER_SITE_OTHER): Payer: Self-pay | Admitting: Neurology

## 2015-11-30 ENCOUNTER — Telehealth: Payer: Self-pay | Admitting: Neurology

## 2015-11-30 VITALS — BP 158/66 | HR 70 | Ht 67.0 in | Wt 167.0 lb

## 2015-11-30 DIAGNOSIS — G20 Parkinson's disease: Secondary | ICD-10-CM

## 2015-11-30 MED ORDER — CARBIDOPA-LEVODOPA 25-250 MG PO TABS: 2.0000 | ORAL_TABLET | Freq: Four times a day (QID) | ORAL | 3 refills | Status: DC

## 2015-11-30 NOTE — Telephone Encounter (Signed)
LMOM making patient aware.  

## 2015-11-30 NOTE — Telephone Encounter (Signed)
-----   Message from Octaviano Battyebecca S Tat, DO sent at 11/30/2015 12:50 PM EDT ----- Tell him MVI won't hurt him and no objection to that but not sure it will help those things.  You will need translator ----- Message ----- From: Orson EvaSuzanna A Kidd, LCSW Sent: 11/30/2015  12:28 PM To: Vladimir Fasterebecca S Tat, DO, Silvio PateJade L McCracken, CMA  Patient asked during our time about taking a vitamin? C/O legs feeling weak, tired, pain in back, and blurry vision. I am not sure if this was brought up to you during your visit, so I told him I would send a message. I will do some research in regard to the walker. Thanks.

## 2015-11-30 NOTE — Progress Notes (Addendum)
Psychosocial/Social Work Assessment     [facility], [facility]  Patient: Brandon Carrillo MRN: [id number] Date of Assessment: 11/30/2015  Assessment completed with Spanish Interpreter Present  PHYSICAL/MEDICAL SITUATION- As Verbally Reported by Patient and/or Carepartner   Current Movement Disorder Diagnosis?: Idiopathic Parkinson's disease.  Goal(s) for Attending Clinic:  Follow up appointment   DEMOGRAPHIC INFORMATION  Race/Ethnicity: Hispanic  Age: 70  Relationship Status    Widowed      Living Situation     Alone    Employment Status     Unemployed      COGNITION  AND MENTAL  STATUS Alert   Oriented x 3  Comments:  Patient is spanish speaking, but answered appropriately using spanish interpreter     AMBULATION     Has balance issues but no falls: pt denies falls        AMBULATION ASSISTANCE   Answer the following as Yes (Y) or No (N)   Walks independently: Y- walks unassisted at times  Uses a cane: Y- pt reports use of cane at home  Uses a walker: Pt asking for assistance in obtaining a walker     Driving  Currently driving? No  Patient or carepartner reports driving issues: N/A   Comments: Pt agreeable to completion of application for [employer] transportation to assist with transportation to medical appointments.      Mood and Affect   Expressed depressed mood surrounding not hearing anything from meals on wheels since they came to his home to visit.      Family live nearby? Daughter, Brandon Carrillo lives nearby, but has small children and pt reports that she assist when she is able (daughter present at today's visit)    Support System    Poor  Other/Comments: pt reports support from pt daughter and [place]    Reported (if any) preference for receiving information:  Patient reports that he prefers for daughter, Brandon Carrillo (ph #: [phone]) be contacted using  Spanish Interpreter to provide information.    Overall Impression:   CSW received referral during today's visit in the movement disorders clinic. Main psychosocial concerns as reported by patient are Meals on Wheels, transportation, and needing assistance obtaining a walker. CSW met with pt and pt daughter, Brandon Carrillo (ph#: [phone])  with a spanish interpreter present in exam room. CSW introduced self and explained role.   Pt discussed that he was concerned because [employer] came to the pt home after referral was made following last visit, but pt has not yet received a meal delivery. CSW provided support as pt discussed that he was concerned about why they had not delivered a meal because he knows that he was approved for one meal a day. CSW obtained permission from pt to contact [employer] for clarification.  CSW discussed [employer] transportation services. Pt discussed that he is agreeable to applying for [employer] transportation services. Pt reports that he only travels to doctors appointments and church. Pt states that pt daughter, Brandon Carrillo assist pt to doctors appointments, but pt would like the transportation service as pt daughter has small children. CSW with the assistance of the spanish interpreter completed the [employer] eligibility questionnaire form with pt and Professional Verification Form will be completed and faxed into [employer]. CSW notified pt that [employer] will follow up with pt about scheduling a face to face interview to determine eligibility and pt will be  notified within 21 days following face to face interview if approved. CSW notified pt that [employer] is a small fee for transportation and pt expressed understanding.  Pt reports that he uses a cane at home, but hopeful to obtain a walker. Pt and pt daughter state that during a previous hospital admission that he was promised a walker, but never received one. CSW discussed that unfortunately not having a payer source may have impacted that and  CSW will research agencies that donate walkers or can assist in covering the cost of a walker. During discussion of walker, pt asked about taking a Vitamin to help with his "legs feeling weak, being tired, pain in back, & blurry vision". CSW discussed that is a best question for Dr. Carles Collet and CSW will send message to Dr. Carles Collet on pt behalf regarding this question/concern. Pt expressed understanding.   Pt tearful during parts of discussion especially surrounding why [employer] did not follow up as pt did not understand why he has not heard anything further. Emotional support and reassurance provided. Pt appreciative of CSW support and assistance and is agreeable for CSW to contact pt daughter, Brandon Carrillo when CSW able to clarify about Meals on Wheels status and gathers information about walker.  CSW provided pt with CSW contact information and notified pt that if he calls to state name only on voicemail and CSW can return phone call using spanish interpreter.  CSW to continue to follow up to assist with social work services as needed.   Social Work Plan:  1. CSW to [employer] on Wheels to determine status.  CSW contacted [employer] who stated that pt is approved for meals, but pt is currently on the waiting list for meals to be delivered. Per [employer] representative, Rica Mote, pt will get a phone call and letter when meals will start being delivered. CSW reminded [employer] representative that pt is spanish speaking only.  2. CSW to submit application to [employer] transportation services.  3. CSW will explore community resources that may assist with obtaining pt a walker.  CSW spoke with [place] who state that walkers are donated to them and they give these donated walkers to those in need. CSW to notify pt of this resources.  4. CSW sent message to Dr. Carles Collet and Margarette Asal, [place] regarding pt question about taking a vitamin.   [place] has been  informed of social work services (including connection to [employer] and transportation resources) and has agreed to contact me if I can be of further assistance with any other social work services arise before next visit.   5. The information contained in this note has been reported to, and discussed with, Dr. Carles Collet.    Alison Murray, MSW, LCSW Clinical Social Worker Movement [facility] [phone]

## 2015-12-01 ENCOUNTER — Telehealth: Payer: Self-pay | Admitting: Clinical

## 2015-12-01 NOTE — Telephone Encounter (Signed)
CSW attempted to reach pt daughter, Bernadene BellKarina Flores via telephonic spanish interpreter per pt request during visit yesterday to f/u re: Meals [employer] Wheels, [employer] transportation, and walker. Unable to reach at this time and message left via spanish interpreter. CSW will re-attempt tomorrow.  Loletta SpecterSuzanna Nanda Bittick, MSW, LCSW Clinical Social Worker [facility] [phone]

## 2015-12-03 ENCOUNTER — Telehealth: Payer: Self-pay | Admitting: Clinical

## 2015-12-03 MED FILL — CARBIDOPA-LEVODOPA 25-250 T: 25-250 | 30 days supply | Qty: 180 | Fill #2

## 2015-12-03 NOTE — Telephone Encounter (Signed)
Clinical Social Work-Late Entry  CSW spoke with pt daughter, Bernadene BellKarina Flores [phone]) on 12/02/15 at 4:00 pm via telephonic spanish interpreter per pt request. CSW notified pt daughter that pt is on waiting list for [employer] and they will call and send pt a letter once pt meals are scheduled to deliver. Pt daughter agreeable to mailing pt list of other food resources in [place]. Discussed with pt daughter that [employer] application was faxed and CSW has contacted [employer] office and left message, awaiting call back about how pt should proceed with setting up in person interview. CSW will update pt once clarified. CSW notified pt daughter that a walker was obtained for [employer] [place] and anyone can come to office during regular business hours Mon-Fri to pick up walker. Pt daughter expressed understanding and agreed to relay above information to pt.   Loletta SpecterSuzanna Kidd, MSW, LCSW Clinical Social Worker [facility] [phone]

## 2015-12-21 MED FILL — CARBIDOPA-LEVODOPA 25-250 T: 25-250 | 30 days supply | Qty: 240 | Fill #0

## 2016-01-10 ENCOUNTER — Other Ambulatory Visit: Payer: Self-pay | Admitting: Family Medicine

## 2016-01-10 DIAGNOSIS — B351 Tinea unguium: Secondary | ICD-10-CM

## 2016-01-10 DIAGNOSIS — E559 Vitamin D deficiency, unspecified: Secondary | ICD-10-CM

## 2016-01-10 MED FILL — CARBIDOPA-LEVODOPA 25-250 T: 25-250 | 30 days supply | Qty: 180 | Fill #3

## 2016-01-10 MED FILL — CARBIDOPA-LEVO ER 50-200 TA: 50-200 | 30 days supply | Qty: 30 | Fill #1

## 2016-01-10 MED FILL — PRAMIPEXOLE 0.5 MG TABLET: 0.5 | 30 days supply | Qty: 90 | Fill #3

## 2016-01-10 MED FILL — METFORMIN HCL ER 500 MG TAB: 500 | 30 days supply | Qty: 30 | Fill #2

## 2016-01-11 MED FILL — VIT D2 1.25 MG (50,000 UNIT: 1.25 MG | 56 days supply | Qty: 8 | Fill #0

## 2016-01-11 MED FILL — TERBINAFINE HCL 250 MG TAB: 250 | 30 days supply | Qty: 30 | Fill #0

## 2016-01-31 ENCOUNTER — Ambulatory Visit: Payer: Self-pay | Attending: Family Medicine

## 2016-01-31 ENCOUNTER — Other Ambulatory Visit: Payer: Self-pay | Admitting: Neurology

## 2016-02-01 MED FILL — CARBIDOPA-LEVODOPA 25-250 T: 25-250 | 30 days supply | Qty: 240 | Fill #0

## 2016-02-28 ENCOUNTER — Other Ambulatory Visit: Payer: Self-pay | Admitting: Family Medicine

## 2016-02-28 DIAGNOSIS — E559 Vitamin D deficiency, unspecified: Secondary | ICD-10-CM

## 2016-02-28 MED FILL — CARBIDOPA-LEVODOPA 25-250 T: 25-250 | 30 days supply | Qty: 240 | Fill #1

## 2016-02-28 MED FILL — METFORMIN HCL ER 500 MG TAB: 500 | 30 days supply | Qty: 30 | Fill #3

## 2016-02-28 MED FILL — PRAMIPEXOLE 0.5 MG TABLET: 0.5 | 30 days supply | Qty: 90 | Fill #4

## 2016-02-28 MED FILL — TERBINAFINE HCL 250 MG TAB: 250 | 30 days supply | Qty: 30 | Fill #1
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
## 2016-03-02 ENCOUNTER — Encounter: Payer: Self-pay | Admitting: Neurology

## 2016-03-02 ENCOUNTER — Ambulatory Visit (INDEPENDENT_AMBULATORY_CARE_PROVIDER_SITE_OTHER): Payer: Self-pay | Admitting: Neurology

## 2016-03-02 VITALS — BP 124/70 | HR 67 | Ht 67.0 in | Wt 175.0 lb

## 2016-03-02 DIAGNOSIS — G20 Parkinson's disease: Secondary | ICD-10-CM

## 2016-03-02 DIAGNOSIS — E119 Type 2 diabetes mellitus without complications: Secondary | ICD-10-CM

## 2016-03-14 ENCOUNTER — Other Ambulatory Visit: Payer: Self-pay | Admitting: Neurology

## 2016-03-20 MED FILL — CARBIDOPA-LEVODOPA 25-250 T: 25-250 | 30 days supply | Qty: 240 | Fill #2

## 2016-03-23 ENCOUNTER — Encounter: Payer: Self-pay | Admitting: Neurology

## 2016-03-27 ENCOUNTER — Ambulatory Visit: Payer: Self-pay

## 2016-03-28 ENCOUNTER — Encounter: Payer: Self-pay | Admitting: Physical Therapy

## 2016-03-28 NOTE — Therapy (Signed)
[facility] [address] [place], [place], [address] Phone: [phone]   Fax:  [phone]  Patient Details  Name: Brandon Carrillo MRN: [id number] Date of Birth: 11-Aug-1945 Referring Provider:  No ref. provider found  Encounter Date: 03/28/2016  PHYSICAL THERAPY DISCHARGE SUMMARY  Visits from Start of Care: 5  Current functional level related to goals / functional outcomes: LTGs not fully able to be addressed, as pt was awaiting additional word from financial assistance.   Remaining deficits: Posture, balance, gait   Education / Equipment: Initiated HEP  Plan: Patient agrees to discharge.  Patient goals were not met. Patient is being discharged due to not returning since the last visit.  ?????/financial concerns.      MARRIOTT,AMY W. 03/28/2016, 1:50 PM Frazier Butt., PT [facility] [address] [place], [place], [address] Phone: [phone]   Fax:  [phone]

## 2016-04-07 MED FILL — METFORMIN HCL ER 500 MG TAB: 500 | 30 days supply | Qty: 30 | Fill #4

## 2016-04-07 MED FILL — CARBIDOPA-LEVODOPA 25-250 T: 25-250 | 30 days supply | Qty: 240 | Fill #3

## 2016-04-07 MED FILL — PRAMIPEXOLE 0.5 MG TABLET: 0.5 | 30 days supply | Qty: 90 | Fill #5

## 2016-04-07 MED FILL — TERBINAFINE HCL 250 MG TAB: 250 | 30 days supply | Qty: 30 | Fill #2

## 2016-05-05 MED FILL — CARBIDOPA-LEVODOPA 25-250 T: 25-250 | 30 days supply | Qty: 240 | Fill #4

## 2016-05-22 ENCOUNTER — Other Ambulatory Visit: Payer: Self-pay | Admitting: Family Medicine

## 2016-05-22 DIAGNOSIS — B351 Tinea unguium: Secondary | ICD-10-CM

## 2016-05-22 DIAGNOSIS — E559 Vitamin D deficiency, unspecified: Secondary | ICD-10-CM

## 2016-05-22 MED FILL — PRAMIPEXOLE 0.5 MG TABLET: 0.5 | 30 days supply | Qty: 90 | Fill #6

## 2016-05-22 MED FILL — CARBIDOPA-LEVODOPA 25-250 T: 25-250 | 30 days supply | Qty: 180 | Fill #0

## 2016-05-22 MED FILL — METFORMIN HCL ER 500 MG TAB: 500 | 30 days supply | Qty: 30 | Fill #5

## 2016-05-29 ENCOUNTER — Encounter (HOSPITAL_COMMUNITY): Payer: Self-pay

## 2016-05-29 ENCOUNTER — Emergency Department (HOSPITAL_COMMUNITY): Payer: Self-pay

## 2016-05-29 ENCOUNTER — Emergency Department (HOSPITAL_COMMUNITY)
Admission: EM | Admit: 2016-05-29 | Discharge: 2016-05-29 | Disposition: A | Discharge status: Home / Self Care | Payer: Self-pay | Attending: Emergency Medicine | Admitting: Emergency Medicine

## 2016-05-29 DIAGNOSIS — Z7984 Long term (current) use of oral hypoglycemic drugs: Secondary | ICD-10-CM | POA: Insufficient documentation

## 2016-05-29 DIAGNOSIS — E119 Type 2 diabetes mellitus without complications: Secondary | ICD-10-CM | POA: Insufficient documentation

## 2016-05-29 DIAGNOSIS — Z87891 Personal history of nicotine dependence: Secondary | ICD-10-CM | POA: Insufficient documentation

## 2016-05-29 DIAGNOSIS — Z79899 Other long term (current) drug therapy: Secondary | ICD-10-CM | POA: Insufficient documentation

## 2016-05-29 DIAGNOSIS — R1030 Lower abdominal pain, unspecified: Secondary | ICD-10-CM | POA: Insufficient documentation

## 2016-05-29 HISTORY — DX: Pure hypercholesterolemia, unspecified: E78.00

## 2016-05-29 HISTORY — DX: Type 2 diabetes mellitus without complications: E11.9

## 2016-05-29 LAB — COMPREHENSIVE METABOLIC PANEL
ALT: 6 U/L — ABNORMAL LOW (ref 17–63)
AST: 21 U/L (ref 15–41)
Albumin: 4 g/dL (ref 3.5–5.0)
Alkaline Phosphatase: 62 U/L (ref 38–126)
Anion gap: 8 (ref 5–15)
BUN: 10 mg/dL (ref 6–20)
CO2: 24 mmol/L (ref 22–32)
Calcium: 9 mg/dL (ref 8.9–10.3)
Chloride: 104 mmol/L (ref 101–111)
Creatinine, Ser: 0.63 mg/dL (ref 0.61–1.24)
GFR calc Af Amer: >60 mL/min (ref >60)
GFR calc non Af Amer: >60 mL/min (ref >60)
Glucose, Bld: 127 mg/dL — ABNORMAL HIGH (ref 65–99)
Potassium: 4 mmol/L (ref 3.5–5.1)
Sodium: 136 mmol/L (ref 135–145)
Total Bilirubin: 0.7 mg/dL (ref 0.3–1.2)
Total Protein: 8 g/dL (ref 6.5–8.1)

## 2016-05-29 LAB — URINALYSIS, ROUTINE W REFLEX MICROSCOPIC
Bacteria, UA: NONE SEEN
Bilirubin Urine: NEGATIVE
Glucose, UA: NEGATIVE mg/dL
Hgb urine dipstick: NEGATIVE
Ketones, ur: NEGATIVE mg/dL
Leukocytes, UA: NEGATIVE
Nitrite: NEGATIVE
Protein, ur: NEGATIVE mg/dL
Specific Gravity, Urine: 1.001 — ABNORMAL LOW (ref 1.005–1.030)
Squamous Epithelial / HPF: NONE SEEN
pH: 8 (ref 5.0–8.0)

## 2016-05-29 LAB — CBC WITH DIFFERENTIAL/PLATELET
Basophils Absolute: 0 10*3/uL (ref 0.0–0.1)
Basophils Relative: 0 %
Eosinophils Absolute: 0 10*3/uL (ref 0.0–0.7)
Eosinophils Relative: 0 %
HCT: 39.2 % (ref 39.0–52.0)
Hemoglobin: 13.1 g/dL (ref 13.0–17.0)
Lymphocytes Relative: 25 %
Lymphs Abs: 2 10*3/uL (ref 0.7–4.0)
MCH: 30.9 pg (ref 26.0–34.0)
MCHC: 33.4 g/dL (ref 30.0–36.0)
MCV: 92.5 fL (ref 78.0–100.0)
Monocytes Absolute: 1 10*3/uL (ref 0.1–1.0)
Monocytes Relative: 13 %
Neutro Abs: 4.9 10*3/uL (ref 1.7–7.7)
Neutrophils Relative %: 62 %
Platelets: 337 10*3/uL (ref 150–400)
RBC: 4.24 MIL/uL (ref 4.22–5.81)
RDW: 13.4 % (ref 11.5–15.5)
WBC: 7.9 10*3/uL (ref 4.0–10.5)

## 2016-05-29 LAB — LIPASE, BLOOD: Lipase: 29 U/L (ref 11–51)

## 2016-05-29 MED ORDER — SODIUM CHLORIDE 0.9 % IV BOLUS (SEPSIS)
1000.0000 mL | Freq: Once | INTRAVENOUS | Status: AC
  Administered 2016-05-29: 1000 mL via INTRAVENOUS

## 2016-05-29 MED ORDER — IOPAMIDOL (ISOVUE-300) INJECTION 61%
INTRAVENOUS | Status: AC
  Administered 2016-05-29: 100 mL
  Filled 2016-05-29: qty 100

## 2016-05-29 MED ORDER — MORPHINE SULFATE (PF) 4 MG/ML IV SOLN
4.0000 mg | Freq: Once | INTRAVENOUS | Status: AC
  Administered 2016-05-29: 4 mg via INTRAVENOUS
  Filled 2016-05-29: qty 1

## 2016-05-29 MED ORDER — CARBIDOPA-LEVODOPA 25-250 MG PO TABS
1.0000 | ORAL_TABLET | Freq: Once | ORAL | Status: DC
  Filled 2016-05-29: qty 1

## 2016-05-29 MED ORDER — SODIUM CHLORIDE 0.9 % IJ SOLN
INTRAMUSCULAR | Status: AC
  Filled 2016-05-29: qty 50

## 2016-05-29 MED ORDER — PRAMIPEXOLE DIHYDROCHLORIDE 0.25 MG PO TABS
0.5000 mg | ORAL_TABLET | Freq: Once | ORAL | Status: DC
  Filled 2016-05-29: qty 2

## 2016-05-29 MED ORDER — IOPAMIDOL (ISOVUE-300) INJECTION 61%
INTRAVENOUS | Status: AC
  Administered 2016-05-29: 30 mL via ORAL
  Filled 2016-05-29: qty 30

## 2016-05-29 MED ORDER — POLYETHYLENE GLYCOL 3350 17 G PO PACK: 34.0000 g | PACK | Freq: Every day | ORAL | Status: DC

## 2016-05-29 MED ORDER — POLYETHYLENE GLYCOL 3350 17 G PO PACK: 17.0000 g | PACK | Freq: Two times a day (BID) | ORAL | 0 refills | Status: DC | PRN

## 2016-05-29 MED ORDER — IOPAMIDOL (ISOVUE-300) INJECTION 61%
30.0000 mL | Freq: Once | INTRAVENOUS | Status: AC | PRN
  Administered 2016-05-29: 30 mL via ORAL

## 2016-05-29 NOTE — ED Notes (Signed)
Brandon Carrillo (SON) MADE AWARE PT IS BEING DISCHARGE. SON STATES HE WILL BE 1 HOUR.
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
## 2016-05-29 NOTE — ED Provider Notes (Signed)
[facility]-EMERGENCY DEPT Provider Note   CSN: [id number] Arrival date & time: 05/29/16  1026  By signing my name below, I, Freida Busman, attest that this documentation has been prepared under the direction and in the presence of Raeford Razor, MD . Electronically Signed: Freida Busman, Scribe. 05/29/2016. 11:32 AM.  History   Chief Complaint Chief Complaint  Patient presents with  . Abdominal Pain   The history is provided by the patient. A language interpreter was used (spanish).     HPI Comments:  Brandon Carrillo is a 71 y.o. male with a history of DM and  Parkinson's disease, who presents to the Emergency Department complaining of gradual onset, gradually worsening lower abdominal pain x 4 days. His pain is intermittent and moderately relieved from taking his Parkinson's meds and Alka seltzer. Pt states chamomile tea has also provided mild relief. He notes associated difficulty urinating; reports urinating in small bursts and states he has had urinary incontinence. He has a h/o similar urinary symptoms but has not been worked up for it.     Past Medical History:  Diagnosis Date  . Depression   . Diabetes mellitus without complication (HCC)   . Hypercholesteremia   . Parkinson's disease (HCC)   . Parkinson's disease [facility])     Patient Active Problem List   Diagnosis Date Noted  . Onychomycosis of toenail 09/17/2015  . Constipation 09/17/2015  . Bilateral knee pain 09/17/2015  . Other fatigue 09/17/2015  . Vitamin D deficiency 09/17/2015  . Pain, dental 09/16/2015  . Diabetes type 2, controlled (HCC) 03/02/2015  . Fall at home 03/02/2015  . At high risk for falls 03/02/2015  . NSTEMI (non-ST elevated myocardial infarction) (HCC) 08/30/2014  . Parkinson's disease (HCC) 10/21/2013  . Depression 10/21/2013  . Midline thoracic back pain 10/21/2013    Past Surgical History:  Procedure Laterality Date  . LEG SURGERY         Home Medications    Prior to Admission  medications   Medication Sig Start Date End Date Taking? Authorizing Provider  carbidopa-levodopa (SINEMET CR) 50-200 MG tablet Take 1 tablet by mouth at bedtime. 04/29/15   Rebecca S Tat, DO  carbidopa-levodopa (SINEMET IR) 25-250 MG tablet Take 2 tablets by mouth 4 (four) times daily. 11/30/15   Rebecca S Tat, DO  carbidopa-levodopa (SINEMET IR) 25-250 MG tablet TAKE 2 TABLETS BY MOUTH 3 TIMES DAILY. 03/14/16   Octaviano Batty Tat, DO  metFORMIN (GLUCOPHAGE XR) 500 MG 24 hr tablet Take 1 tablet (500 mg total) by mouth daily after supper. 09/17/15   Josalyn Funches, MD  pramipexole (MIRAPEX) 0.5 MG tablet TAKE 1 TABLET BY MOUTH 3 TIMES DAILY. 09/16/15   Rebecca S Tat, DO  terbinafine (LAMISIL) 250 MG tablet TAKE 1 TABLET BY MOUTH DAILY. 01/11/16   Josalyn Funches, MD  Vitamin D, Ergocalciferol, (DRISDOL) 50000 units CAPS capsule TAKE 1 CAPSULE BY MOUTH EVERY 7 DAYS FOR 8 WEEKS 01/11/16   Dessa Phi, MD    Family History Family History  Problem Relation Age of Onset  . Diabetes Mother     Social History Social History  Substance Use Topics  . Smoking status: Former Games developer  . Smokeless tobacco: Not on file     Comment: as a teenager  . Alcohol use No     Allergies   Patient has no known allergies.   Review of Systems Review of Systems  Gastrointestinal: Positive for abdominal pain.  Genitourinary: Positive for difficulty urinating.       +  Urinary Incontinence   All other systems reviewed and are negative.  Physical Exam Updated Vital Signs BP (!) 112/52   Pulse 65   Temp 98 F (36.7 C) (Oral)   Resp 15   SpO2 96%   Physical Exam  Constitutional: He is oriented to person, place, and time. He appears well-developed and well-nourished.  HENT:  Head: Normocephalic and atraumatic.  Eyes: EOM are normal.  Neck: Normal range of motion.  Cardiovascular: Normal rate, regular rhythm, normal heart sounds and intact distal pulses.   Pulmonary/Chest: Effort normal and breath sounds  normal. No respiratory distress.  Abdominal: Soft. He exhibits no distension. There is tenderness (mild) in the suprapubic area.  Bladder does not feel distended  Incontinent of urine   Musculoskeletal: Normal range of motion.  Neurological: He is alert and oriented to person, place, and time.  Skin: Skin is warm and dry.  Psychiatric: He has a normal mood and affect. Judgment normal.  Nursing note and vitals reviewed.    ED Treatments / Results  DIAGNOSTIC STUDIES:  Oxygen Saturation is 96% on RA, normal by my interpretation.    COORDINATION OF CARE:  11:34 AM Discussed treatment plan with pt at bedside and pt agreed to plan.  Labs (all labs ordered are listed, but only abnormal results are displayed) Labs Reviewed  URINE CULTURE - Abnormal; Notable for the following:       Result Value   Culture MULTIPLE SPECIES PRESENT, SUGGEST RECOLLECTION (*)    All other components within normal limits  URINALYSIS, ROUTINE W REFLEX MICROSCOPIC - Abnormal; Notable for the following:    Color, Urine COLORLESS (*)    Specific Gravity, Urine 1.001 (*)    All other components within normal limits  COMPREHENSIVE METABOLIC PANEL - Abnormal; Notable for the following:    Glucose, Bld 127 (*)    ALT 6 (*)    All other components within normal limits  CBC WITH DIFFERENTIAL/PLATELET  LIPASE, BLOOD    EKG  EKG Interpretation None       Radiology No results found.   Ct Abdomen Pelvis W Contrast  Result Date: 05/29/2016 CLINICAL DATA:  Lower abdominal pain.  Pain for 5 days. EXAM: CT ABDOMEN AND PELVIS WITH CONTRAST TECHNIQUE: Multidetector CT imaging of the abdomen and pelvis was performed using the standard protocol following bolus administration of intravenous contrast. CONTRAST:  1 ISOVUE-300 IOPAMIDOL (ISOVUE-300) INJECTION 61% COMPARISON:  09/04/2009 FINDINGS: Lower chest: No acute abnormality. Hepatobiliary: No focal liver abnormality is seen. No gallstones, gallbladder wall  thickening, or biliary dilatation. Pancreas: Unremarkable. No pancreatic ductal dilatation or surrounding inflammatory changes. Spleen: Normal in size without focal abnormality. Adrenals/Urinary Tract: Normal right adrenal gland. 2 cm hypodense left adrenal mass measuring 7 Hounsfield units most consistent with an adrenal adenoma. No solid renal mass. No urolithiasis or obstructive uropathy. 5.8 x 4.4 cm hypodense, fluid attenuating right inferior pole renal mass most consistent with a cyst. Stomach/Bowel: Stomach is within normal limits. No evidence of bowel wall thickening, distention, or inflammatory changes. Moderate amount of stool throughout the colon. Vascular/Lymphatic: Normal caliber abdominal aorta with mild atherosclerosis. No lymphadenopathy. Reproductive: Mild prostate enlargement. Other: No abdominal wall hernia or abnormality. No abdominopelvic ascites. Musculoskeletal: No acute osseous abnormality. Bilateral facet arthropathy at L3-4, L4-5 and L5-S1. IMPRESSION: 1. No acute abdominal or pelvic pathology. 2. Moderate amount of stool throughout the colon. 3. Left adrenal adenoma. Electronically Signed   By: Elige KoHetal  Patel   On: 05/29/2016 16:28  Procedures Procedures (including critical care time)  Medications Ordered in ED Medications - No data to display   Initial Impression / Assessment and Plan / ED Course  I have reviewed the triage vital signs and the nursing notes.  Pertinent labs & imaging results that were available during my care of the patient were reviewed by me and considered in my medical decision making (see chart for details).     71yM with lower abdominal pain. Some tenderness on exam, but no peritonitis. He does not have a UTI. His blood work is pretty unremarkable. Will CT. I suspect that this may actually be from constipation though. Family has concerns he may be taking his Parkinson's meds too frequently. This could certainly cause constipation. Pt advised to take  only as prescribed and that he wouldn't additional improvement of his Parkinson's symptoms and would only likely cause increased SE.    Final Clinical Impressions(s) / ED Diagnoses   Final diagnoses:  Lower abdominal pain    New Prescriptions New Prescriptions   No medications on file    I personally preformed the services scribed in my presence. The recorded information has been reviewed is accurate. Raeford Razor, MD.     Raeford Razor, MD 06/03/16 [phone]

## 2016-05-29 NOTE — ED Notes (Signed)
Hubbard HartshornFelix Carreno (son)  [phone]

## 2016-05-29 NOTE — Discharge Instructions (Addendum)
Take your Parkinson's medication as prescribed. They can make you constipated and this will be worse if you take too much. Your Parkinson's symptoms won't get any better if you simply take more of it.

## 2016-05-29 NOTE — ED Notes (Signed)
CHARGE MARTHA RN WILL ADDRESS MEDICATIONS WITH EDP KOHUT. FAMILY CONCERNED PT HAS TAKING TOO MUCH OF MEDICATIONS ORDERED.

## 2016-05-29 NOTE — ED Notes (Signed)
Patient transported to CT 

## 2016-05-29 NOTE — ED Notes (Signed)
Brandon Carrillo PRESENT TO DISCHARGE THIS PT-BELONGING GIVEN TO Carrillo

## 2016-05-29 NOTE — ED Notes (Signed)
Bed: [id number] Expected date:  Expected time:  Means of arrival:  Comments: EVS 10:05

## 2016-05-30 LAB — URINE CULTURE

## 2016-06-08 MED FILL — METFORMIN HCL ER 500 MG TAB: 500 | 30 days supply | Qty: 30 | Fill #6

## 2016-06-08 MED FILL — CARBIDOPA-LEVODOPA 25-250 T: 25-250 | 30 days supply | Qty: 180 | Fill #1

## 2016-06-14 ENCOUNTER — Other Ambulatory Visit: Payer: Self-pay | Admitting: Family Medicine

## 2016-06-14 ENCOUNTER — Other Ambulatory Visit: Payer: Self-pay | Admitting: Neurology

## 2016-06-14 DIAGNOSIS — B351 Tinea unguium: Secondary | ICD-10-CM

## 2016-06-14 DIAGNOSIS — E559 Vitamin D deficiency, unspecified: Secondary | ICD-10-CM
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
## 2016-06-30 ENCOUNTER — Ambulatory Visit: Payer: Self-pay | Admitting: Neurology

## 2016-07-04 ENCOUNTER — Ambulatory Visit: Payer: Self-pay | Admitting: Neurology

## 2016-07-10 MED FILL — CARBIDOPA-LEVODOPA 25-250 T: 25-250 | 30 days supply | Qty: 180 | Fill #2

## 2016-07-10 MED FILL — PRAMIPEXOLE 0.5 MG TABLET: 0.5 | 30 days supply | Qty: 90 | Fill #0

## 2016-07-10 MED FILL — METFORMIN HCL ER 500 MG TAB: 500 | 30 days supply | Qty: 30 | Fill #7

## 2016-08-09 ENCOUNTER — Other Ambulatory Visit: Payer: Self-pay | Admitting: Neurology

## 2016-08-09 MED FILL — CARBIDOPA-LEVODOPA 25-250 T: 25-250 | 30 days supply | Qty: 180 | Fill #3

## 2016-08-09 MED FILL — METFORMIN HCL ER 500 MG TAB: 500 | 30 days supply | Qty: 30 | Fill #8

## 2016-08-10 MED FILL — PRAMIPEXOLE 0.5 MG TABLET: 0.5 | 30 days supply | Qty: 90 | Fill #0
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
## 2016-08-18 ENCOUNTER — Ambulatory Visit (INDEPENDENT_AMBULATORY_CARE_PROVIDER_SITE_OTHER): Payer: Self-pay | Admitting: Neurology

## 2016-08-18 ENCOUNTER — Encounter: Payer: Self-pay | Admitting: Neurology

## 2016-08-18 VITALS — BP 138/80 | HR 58 | Wt 168.0 lb

## 2016-08-18 DIAGNOSIS — G20 Parkinson's disease: Secondary | ICD-10-CM

## 2016-08-18 DIAGNOSIS — K5901 Slow transit constipation: Secondary | ICD-10-CM

## 2016-08-18 MED ORDER — CARBIDOPA-LEVODOPA 25-250 MG PO TABS: 2.0000 | ORAL_TABLET | Freq: Four times a day (QID) | ORAL | 1 refills | Status: DC

## 2016-08-18 MED ORDER — CARBIDOPA-LEVODOPA ER 50-200 MG PO TBCR: 1.0000 | EXTENDED_RELEASE_TABLET | Freq: Every day | ORAL | 1 refills | Status: DC

## 2016-08-18 MED ORDER — PRAMIPEXOLE DIHYDROCHLORIDE 0.25 MG PO TABS: 0.2500 mg | ORAL_TABLET | Freq: Three times a day (TID) | ORAL | 1 refills | Status: DC

## 2016-08-18 NOTE — Addendum Note (Signed)
Addended bySilvio Pate on: 08/18/2016 09:25 AM   Modules accepted: Orders

## 2016-08-18 NOTE — Progress Notes (Signed)
Clinical Social Work Note  CSW following for psychosocial concerns. Concern about pt living condition and pt reporting that he does not have basic care items for self care. Previous office notes from April 2017 indicate pt reported social services has visited home due to concern of heat/housing situation. No further reports of issues with heating/air since. CSW confirmed in September that pt is on the waiting list for Meals on Wheels and walker provided to pt.   CSW explored resources and left message with agencies including [employer] [facility] [place] and [facility] [employer] to inquire about resources and determine if resources provided by agencies were appropriate for pt needs. Will refer pt to programs if appropriate once return phone call received.  CSW contact [facility] re: concerns. Discussed concern of self care and unsure of pt living condition. Spoke with APS intake caseworker, Rosalio Macadamia. Provided information surrounding ongoing concerns for pt and concern for pt living environment and lack of resources to care for himself. APS intake caseworker discussed that referral was most appropriate for [facility] Adult [employer]. This service has caseworkers that visit the home to assess needs and determine if APS involvement is appropriate or involve appropriate resources such as, Cabin crew and other resources that the [employer] [facility] can refer to. Per DSS intake caseworker, the Adult Outreach Service usually processes within one week of referral.    Contact pt daughter via telephone using telephonic spanish interpreter to notify of referral to Adult Outreach Service with [facility], but unable to reach at this time and no voice message available to leave message.   Some basic need items provided to pt today.   CSW will continue to reach pt  daughter to notify of services referred and will follow up with [facility] Adult Outreach Service in two weeks to check on progress.  CSW to continue to follow to provide psychosocial support and resources.  Loletta Specter, MSW, LCSW Clinical Social Worker Movement Disorders Clinic [facility] [phone]

## 2016-08-23 MED FILL — PRAMIPEXOLE 0.25 MG TABLET: 0.25 | 30 days supply | Qty: 90 | Fill #0

## 2016-09-04 ENCOUNTER — Telehealth: Payer: Self-pay | Admitting: Clinical

## 2016-09-04 ENCOUNTER — Encounter (HOSPITAL_COMMUNITY): Payer: Self-pay | Admitting: Emergency Medicine

## 2016-09-04 ENCOUNTER — Emergency Department (HOSPITAL_COMMUNITY): Payer: Self-pay

## 2016-09-04 ENCOUNTER — Inpatient Hospital Stay (HOSPITAL_COMMUNITY)
Admission: EM | Admit: 2016-09-04 | Discharge: 2016-09-11 | DRG: 057 | Disposition: A | Discharge status: Home / Self Care | Payer: Self-pay | Attending: Internal Medicine | Admitting: Internal Medicine

## 2016-09-04 DIAGNOSIS — Z23 Encounter for immunization: Secondary | ICD-10-CM

## 2016-09-04 DIAGNOSIS — R4781 Slurred speech: Secondary | ICD-10-CM | POA: Diagnosis present

## 2016-09-04 DIAGNOSIS — Z833 Family history of diabetes mellitus: Secondary | ICD-10-CM

## 2016-09-04 DIAGNOSIS — Z7984 Long term (current) use of oral hypoglycemic drugs: Secondary | ICD-10-CM

## 2016-09-04 DIAGNOSIS — R69 Illness, unspecified: Secondary | ICD-10-CM

## 2016-09-04 DIAGNOSIS — E119 Type 2 diabetes mellitus without complications: Secondary | ICD-10-CM

## 2016-09-04 DIAGNOSIS — Z87891 Personal history of nicotine dependence: Secondary | ICD-10-CM

## 2016-09-04 DIAGNOSIS — Z91138 Patient's unintentional underdosing of medication regimen for other reason: Secondary | ICD-10-CM

## 2016-09-04 DIAGNOSIS — K5900 Constipation, unspecified: Secondary | ICD-10-CM | POA: Diagnosis not present

## 2016-09-04 DIAGNOSIS — R531 Weakness: Secondary | ICD-10-CM

## 2016-09-04 DIAGNOSIS — Z9114 Patient's other noncompliance with medication regimen: Secondary | ICD-10-CM

## 2016-09-04 DIAGNOSIS — G20A1 Parkinson's disease without dyskinesia, without mention of fluctuations: Secondary | ICD-10-CM | POA: Diagnosis present

## 2016-09-04 DIAGNOSIS — R410 Disorientation, unspecified: Secondary | ICD-10-CM | POA: Diagnosis present

## 2016-09-04 DIAGNOSIS — G20 Parkinson's disease: Principal | ICD-10-CM

## 2016-09-04 DIAGNOSIS — R627 Adult failure to thrive: Secondary | ICD-10-CM

## 2016-09-04 DIAGNOSIS — I252 Old myocardial infarction: Secondary | ICD-10-CM

## 2016-09-04 LAB — COMPREHENSIVE METABOLIC PANEL
ALBUMIN: 3.7 g/dL (ref 3.5–5.0)
ALT: 25 U/L (ref 17–63)
ANION GAP: 8 (ref 5–15)
AST: 19 U/L (ref 15–41)
Alkaline Phosphatase: 71 U/L (ref 38–126)
BUN: 13 mg/dL (ref 6–20)
CHLORIDE: 107 mmol/L (ref 101–111)
CO2: 22 mmol/L (ref 22–32)
Calcium: 9 mg/dL (ref 8.9–10.3)
Creatinine, Ser: 0.56 mg/dL — ABNORMAL LOW (ref 0.61–1.24)
GFR calc Af Amer: >60 mL/min (ref >60)
GFR calc non Af Amer: >60 mL/min (ref >60)
GLUCOSE: 107 mg/dL — AB (ref 65–99)
POTASSIUM: 4.1 mmol/L (ref 3.5–5.1)
SODIUM: 137 mmol/L (ref 135–145)
TOTAL PROTEIN: 8.6 g/dL — AB (ref 6.5–8.1)
Total Bilirubin: 0.7 mg/dL (ref 0.3–1.2)

## 2016-09-04 LAB — ETHANOL: Alcohol, Ethyl (B): <5 mg/dL (ref <5)

## 2016-09-04 LAB — URINALYSIS, ROUTINE W REFLEX MICROSCOPIC
BILIRUBIN URINE: NEGATIVE
Glucose, UA: NEGATIVE mg/dL
Hgb urine dipstick: NEGATIVE
Ketones, ur: NEGATIVE mg/dL
LEUKOCYTES UA: NEGATIVE
NITRITE: NEGATIVE
PH: 6 (ref 5.0–8.0)
Protein, ur: NEGATIVE mg/dL
Specific Gravity, Urine: 1.006 (ref 1.005–1.030)

## 2016-09-04 LAB — CBC
HEMATOCRIT: 40.9 % (ref 39.0–52.0)
Hemoglobin: 13.3 g/dL (ref 13.0–17.0)
MCH: 30.6 pg (ref 26.0–34.0)
MCHC: 32.5 g/dL (ref 30.0–36.0)
MCV: 94 fL (ref 78.0–100.0)
Platelets: 341 10*3/uL (ref 150–400)
RBC: 4.35 MIL/uL (ref 4.22–5.81)
RDW: 13.9 % (ref 11.5–15.5)
WBC: 9.4 10*3/uL (ref 4.0–10.5)

## 2016-09-04 LAB — TSH: TSH: 0.779 u[IU]/mL (ref 0.350–4.500)

## 2016-09-04 LAB — LIPID PANEL
CHOL/HDL RATIO: 3.3 ratio
Cholesterol: 134 mg/dL (ref 0–200)
HDL: 41 mg/dL (ref >40)
LDL Cholesterol: 88 mg/dL (ref 0–99)
Triglycerides: 24 mg/dL (ref <150)
VLDL: 5 mg/dL (ref 0–40)

## 2016-09-04 LAB — VITAMIN B12: VITAMIN B 12: 229 pg/mL (ref 180–914)

## 2016-09-04 LAB — PROTIME-INR
INR: 1.05
Prothrombin Time: 13.7 seconds (ref 11.4–15.2)

## 2016-09-04 LAB — CK: CK TOTAL: 133 U/L (ref 49–397)

## 2016-09-04 LAB — GLUCOSE, CAPILLARY: GLUCOSE-CAPILLARY: 86 mg/dL (ref 65–99)

## 2016-09-04 MED ORDER — CARBIDOPA-LEVODOPA 25-250 MG PO TABS
2.0000 | ORAL_TABLET | Freq: Four times a day (QID) | ORAL | Status: DC
  Administered 2016-09-04 – 2016-09-11 (×30): 2 via ORAL
  Filled 2016-09-04 (×33): qty 2

## 2016-09-04 MED ORDER — ADULT MULTIVITAMIN W/MINERALS CH
1.0000 | ORAL_TABLET | Freq: Every day | ORAL | Status: DC
  Administered 2016-09-05 – 2016-09-11 (×7): 1 via ORAL
  Filled 2016-09-04 (×8): qty 1

## 2016-09-04 MED ORDER — SODIUM CHLORIDE 0.9 % IV BOLUS (SEPSIS)
1000.0000 mL | Freq: Once | INTRAVENOUS | Status: AC
  Administered 2016-09-04: 1000 mL via INTRAVENOUS

## 2016-09-04 MED ORDER — ENOXAPARIN SODIUM 40 MG/0.4ML ~~LOC~~ SOLN
40.0000 mg | SUBCUTANEOUS | Status: DC
  Administered 2016-09-04 – 2016-09-10 (×6): 40 mg via SUBCUTANEOUS
  Filled 2016-09-04 (×7): qty 0.4

## 2016-09-04 MED ORDER — INSULIN ASPART 100 UNIT/ML ~~LOC~~ SOLN
0.0000 [IU] | Freq: Three times a day (TID) | SUBCUTANEOUS | Status: DC
  Administered 2016-09-06 – 2016-09-07 (×2): 2 [IU] via SUBCUTANEOUS
  Administered 2016-09-08: 3 [IU] via SUBCUTANEOUS
  Administered 2016-09-09 – 2016-09-10 (×5): 1 [IU] via SUBCUTANEOUS
  Administered 2016-09-11: 2 [IU] via SUBCUTANEOUS

## 2016-09-04 MED ORDER — PNEUMOCOCCAL VAC POLYVALENT 25 MCG/0.5ML IJ INJ
0.5000 mL | INJECTION | INTRAMUSCULAR | Status: AC
  Administered 2016-09-05: 0.5 mL via INTRAMUSCULAR
  Filled 2016-09-04: qty 0.5

## 2016-09-04 MED ORDER — PRAMIPEXOLE DIHYDROCHLORIDE 0.25 MG PO TABS
0.2500 mg | ORAL_TABLET | Freq: Three times a day (TID) | ORAL | Status: DC
  Administered 2016-09-04 – 2016-09-06 (×6): 0.25 mg via ORAL
  Filled 2016-09-04 (×8): qty 1

## 2016-09-04 MED ORDER — MORPHINE SULFATE (PF) 4 MG/ML IV SOLN
4.0000 mg | Freq: Once | INTRAVENOUS | Status: AC
  Administered 2016-09-04: 4 mg via INTRAVENOUS
  Filled 2016-09-04: qty 1

## 2016-09-04 MED ORDER — SODIUM CHLORIDE 0.9 % IV BOLUS (SEPSIS)
500.0000 mL | Freq: Once | INTRAVENOUS | Status: AC
  Administered 2016-09-04: 500 mL via INTRAVENOUS

## 2016-09-04 MED ORDER — METFORMIN HCL ER 500 MG PO TB24: 500.0000 mg | ORAL_TABLET | Freq: Every day | ORAL | Status: DC

## 2016-09-04 MED ORDER — CARBIDOPA-LEVODOPA ER 50-200 MG PO TBCR
1.0000 | EXTENDED_RELEASE_TABLET | Freq: Every day | ORAL | Status: DC
  Administered 2016-09-04 – 2016-09-10 (×7): 1 via ORAL
  Filled 2016-09-04 (×7): qty 1

## 2016-09-04 MED ORDER — LORAZEPAM 2 MG/ML IJ SOLN
1.0000 mg | Freq: Once | INTRAMUSCULAR | Status: AC
  Administered 2016-09-04: 1 mg via INTRAVENOUS
  Filled 2016-09-04: qty 1

## 2016-09-04 MED FILL — CARBIDOPA-LEVODOPA 25-250 T: 25-250 | 30 days supply | Qty: 240 | Fill #5

## 2016-09-04 NOTE — ED Provider Notes (Addendum)
[facility]-EM[facility]Y DEPT Provider Note   CSN: [id number] Arrival date & time: 09/04/16  1053     History   Chief Complaint Chief Complaint  Patient presents with  . Weakness    HPI Brandon Carrillo is a 71 y.o. male.  Patient w hx parkinsons disease, c/o feeling generally weak for the past few days.  States his residence has been very hot, no air conditioning.  With Parkinsons, has been out of meds for the past few weeks, with that, increased shakiness and weakness. Has not been ambulatory, has been urinating on self.  States at times gets food, and other times not.  Feeling progressively weaker. No focal or unilateral numbness or weakness. No fever or chills.    The history is provided by the patient.  Weakness  Pertinent negatives include no shortness of breath, no chest pain, no vomiting, no confusion and no headaches.    Past Medical History:  Diagnosis Date  . Depression   . Diabetes mellitus without complication (HCC)   . Hypercholesteremia   . Parkinson's disease (HCC)   . Parkinson's disease [facility])     Patient Active Problem List   Diagnosis Date Noted  . Onychomycosis of toenail 09/17/2015  . Constipation 09/17/2015  . Bilateral knee pain 09/17/2015  . Other fatigue 09/17/2015  . Vitamin D deficiency 09/17/2015  . Pain, dental 09/16/2015  . Diabetes type 2, controlled (HCC) 03/02/2015  . Fall at home 03/02/2015  . At high risk for falls 03/02/2015  . NSTEMI (non-ST elevated myocardial infarction) (HCC) 08/30/2014  . Parkinson's disease (HCC) 10/21/2013  . Depression 10/21/2013  . Midline thoracic back pain 10/21/2013    Past Surgical History:  Procedure Laterality Date  . LEG SURGERY         Home Medications    Prior to Admission medications   Medication Sig Start Date End Date Taking? Authorizing Provider  carbidopa-levodopa (SINEMET CR) 50-200 MG tablet Take 1 tablet by mouth at bedtime. 08/18/16   Tat, Octaviano Batty, DO  carbidopa-levodopa  (SINEMET IR) 25-250 MG tablet Take 2 tablets by mouth 4 (four) times daily. 08/18/16   Tat, Octaviano Batty, DO  metFORMIN (GLUCOPHAGE XR) 500 MG 24 hr tablet Take 1 tablet (500 mg total) by mouth daily after supper. 09/17/15   Funches, Gerilyn Nestle, MD  Multiple Vitamin (MULTIVITAMIN) tablet Take 1 tablet by mouth daily.    [provider]  pramipexole (MIRAPEX) 0.25 MG tablet Take 1 tablet (0.25 mg total) by mouth 3 (three) times daily. 08/18/16   Tat, Octaviano Batty, DO  pramipexole (MIRAPEX) 0.5 MG tablet TAKE 1 TABLET BY MOUTH 3 TIMES DAILY. 08/10/16   TatOctaviano Batty, DO    Family History Family History  Problem Relation Age of Onset  . Diabetes Mother     Social History Social History  Substance Use Topics  . Smoking status: Former Games developer  . Smokeless tobacco: Never Used     Comment: as a teenager  . Alcohol use No     Allergies   Patient has no known allergies.   Review of Systems Review of Systems  Constitutional: Negative for fever.  HENT: Negative for sore throat.   Eyes: Negative for redness.  Respiratory: Negative for shortness of breath.   Cardiovascular: Negative for chest pain.  Gastrointestinal: Negative for abdominal pain, diarrhea and vomiting.  Genitourinary: Negative for flank pain.  Musculoskeletal: Negative for back pain and neck pain.  Skin: Negative for rash.  Neurological: Positive for weakness. Negative for headaches.  Hematological: Does not bruise/bleed easily.  Psychiatric/Behavioral: Negative for confusion.     Physical Exam Updated Vital Signs BP (!) 151/87 (BP Location: Right Arm)   Pulse 88   Temp 98 F (36.7 C) (Oral)   Resp (!) 21   Ht 5\' 5"  (1.651 m)   Wt 76.2 kg   SpO2 96%   BMI 27.96 kg/m   Physical Exam  Constitutional: He appears well-developed and well-nourished.  HENT:  Mouth/Throat: Oropharynx is clear and moist.  Eyes: Conjunctivae are normal.  Neck: Neck supple. No tracheal deviation present.  Cardiovascular: Normal  rate, regular rhythm, normal heart sounds and intact distal pulses.   Pulmonary/Chest: Effort normal and breath sounds normal. No accessory muscle usage. No respiratory distress.  Abdominal: Soft. Bowel sounds are normal. He exhibits no distension. There is no tenderness.  Genitourinary:  Genitourinary Comments: No cva tenderness. Normal external gu exam.  Musculoskeletal: He exhibits no edema.  Neurological: He is alert.  Marked tremor.  Moves bil extremities purposefully w good strength.  Pt is unable to ambulate.   Skin: Skin is warm and dry. No rash noted.  Psychiatric: He has a normal mood and affect.  Nursing note and vitals reviewed.    ED Treatments / Results  Labs (all labs ordered are listed, but only abnormal results are displayed) Results for orders placed or performed during the hospital encounter of 09/04/16  CBC  Result Value Ref Range   WBC 9.4 4.0 - 10.5 K/uL   RBC 4.35 4.22 - 5.81 MIL/uL   Hemoglobin 13.3 13.0 - 17.0 g/dL   HCT 44.0 34.7 - 42.5 %   MCV 94.0 78.0 - 100.0 fL   MCH 30.6 26.0 - 34.0 pg   MCHC 32.5 30.0 - 36.0 g/dL   RDW 95.6 38.7 - 56.4 %   Platelets 341 150 - 400 K/uL  Comprehensive metabolic panel  Result Value Ref Range   Sodium 137 135 - 145 mmol/L   Potassium 4.1 3.5 - 5.1 mmol/L   Chloride 107 101 - 111 mmol/L   CO2 22 22 - 32 mmol/L   Glucose, Bld 107 (H) 65 - 99 mg/dL   BUN 13 6 - 20 mg/dL   Creatinine, Ser 3.32 (L) 0.61 - 1.24 mg/dL   Calcium 9.0 8.9 - 95.1 mg/dL   Total Protein 8.6 (H) 6.5 - 8.1 g/dL   Albumin 3.7 3.5 - 5.0 g/dL   AST 19 15 - 41 U/L   ALT 25 17 - 63 U/L   Alkaline Phosphatase 71 38 - 126 U/L   Total Bilirubin 0.7 0.3 - 1.2 mg/dL   GFR calc non Af Amer >60 >60 mL/min   GFR calc Af Amer >60 >60 mL/min   Anion gap 8 5 - 15  Protime-INR  Result Value Ref Range   Prothrombin Time 13.7 11.4 - 15.2 seconds   INR 1.05   Urinalysis, Routine w reflex microscopic  Result Value Ref Range   Color, Urine STRAW (A)  YELLOW   APPearance CLEAR CLEAR   Specific Gravity, Urine 1.006 1.005 - 1.030   pH 6.0 5.0 - 8.0   Glucose, UA NEGATIVE NEGATIVE mg/dL   Hgb urine dipstick NEGATIVE NEGATIVE   Bilirubin Urine NEGATIVE NEGATIVE   Ketones, ur NEGATIVE NEGATIVE mg/dL   Protein, ur NEGATIVE NEGATIVE mg/dL   Nitrite NEGATIVE NEGATIVE   Leukocytes, UA NEGATIVE NEGATIVE   Dg Chest Port 1 View  Result Date: 09/04/2016 CLINICAL DATA:  Weakness for 3 days.  malaise EXAM: PORTABLE CHEST 1 VIEW COMPARISON:  May 2016 FINDINGS: There is slight left base atelectasis. Lungs elsewhere are clear. Heart size and pulmonary vascularity are normal. No adenopathy. No bone lesions. IMPRESSION: Slight left base atelectasis.  No edema or consolidation. Electronically Signed   By: Bretta BangWilliam  Woodruff III M.D.   On: 09/04/2016 11:55    EKG  EKG Interpretation  Date/Time:  Monday Sep 04 2016 11:19:21 EDT Ventricular Rate:  128 PR Interval:    QRS Duration: 148 QT Interval:  386 QTC Calculation: 465 R Axis:   6 Text Interpretation:  Poor data quality Undetermined rhythm Non-specific ST-t changes Confirmed by Denton LankSTEINL  MD, Caryn BeeKEVIN ([id number]) on 09/04/2016 11:22:16 AM       Radiology Dg Chest Port 1 View  Result Date: 09/04/2016 CLINICAL DATA:  Weakness for 3 days.  malaise EXAM: PORTABLE CHEST 1 VIEW COMPARISON:  May 2016 FINDINGS: There is slight left base atelectasis. Lungs elsewhere are clear. Heart size and pulmonary vascularity are normal. No adenopathy. No bone lesions. IMPRESSION: Slight left base atelectasis.  No edema or consolidation. Electronically Signed   By: Bretta BangWilliam  Woodruff III M.D.   On: 09/04/2016 11:55    Procedures Procedures (including critical care time)  Medications Ordered in ED Medications  sodium chloride 0.9 % bolus 500 mL (500 mLs Intravenous New Bag/Given 09/04/16 1148)     Initial Impression / Assessment and Plan / ED Course  I have reviewed the triage vital signs and the nursing  notes.  Pertinent labs & imaging results that were available during my care of the patient were reviewed by me and considered in my medical decision making (see chart for details).  Iv ns bolus. Labs.  Reviewed nursing notes and prior charts for additional history.   SW consulted.  Additional ivf.    Po fluids. Meal  Await social work consult re potentially unsafe home situation given pts physical limitations, access to meds, etc.  Patient unable to ambulate.  ?mild heat related illness and worsening of Parkinsons off meds.   SW indicatres have made APS referral, but that will not be able to place in ecf from ed.    Pt is unable to ambulate or care for self, unable to safely return home.  Medical service consulted for admission.  Discussed with Internal Medicine Resident on call - they will see/admit.   Final Clinical Impressions(s) / ED Diagnoses   Final diagnoses:  None    New Prescriptions New Prescriptions   No medications on file        Cathren LaineSteinl, Yasin Ducat, MD 09/04/16 1554

## 2016-09-04 NOTE — Progress Notes (Signed)
CSW consult received re: "pt unable to care for self, unsafe home situation". Per MD report, Patient w hx parkinsons disease, c/o feeling generally weak for the past few days.  States his residence has been very hot, no air conditioning.  With Parkinsons, has been out of meds for the past few weeks, with that, increased shakiness and weakness. Has not been ambulatory, has been urinating on self.  States at times gets food, and other times not.  Feeling progressively weaker.  CSW contacted Patient's social worker at [facility], Suzanna Kidd LCSW. Ms. Rosilyn MingsKidd reports that she made a referral on behalf of Patient on 08/18/2016 to [facility] [employer]. This service has caseworkers that visit the home to assess needs and determine if APS involvement is appropriate or involve appropriate resources such as, [employer] [facility] and other resources that the [employer] [facility] can refer to.  Patient w/ no insurance per registration. Patient's PCP is Dr. Dessa [facility]Josalyn Funches at [facility] and Patient has an [employer] Card. Medications are covered by [employer] Card and Patient can obtain them at [facility].    This CSW has made APS report with Jennette Kettleendra Marcus regarding concerns with unsafe living conditions.   Enos FlingAshley Joesph Marcy, MSW, LCSW [facility]/6M Clinical Social Worker [phone]

## 2016-09-04 NOTE — H&P (Signed)
Date: 09/04/2016         Brandon Carrillo Name:  Brandon Carrillo MRN: [id number]  DOB: 12/05/1945 Age / Sex: 71 y.o., male   PCP: Dessa Phi, MD         Medical Service: Internal Medicine Teaching Service         Attending Physician: Dr. Burns Spain, MD    First Contact: Dr. Carolynn Comment Pager: [phone]  Second Contact: Dr. Reggie Pile Pager: [phone]       After Hours (After 5p /  First Contact Pager: [phone]  Weekends / Holidays): Second Contact Pager: [phone]   Chief Complaint: generalized weakness  History of Present Illness: Brandon Carrillo is a 71 y.o. male with a h/o of Parkinsons Disease (followed in [facility]) who presents with c/o generalized weakness, medication non-adherence, and inability to care for himself.  Per the EMR, [facility] LCSW had concerns that Brandon Carrillo was unable to care for himself and had unsafe living conditions at home by himself. She contacted DSS who reportedly made referral to [facility] [employer] for resources. Brandon Carrillo now presents from home via EMS with generalized weakness, having run out of Brandon Carrillo medications x 6 days.  Brandon Carrillo is spanish-speaker and interview was conducted via tele interpreter. Brandon Carrillo was disoriented and slurring speech, making understanding very difficult with interpretation. Brandon Carrillo denied acute pain in chest, belly. No SOB. No burning w/ urination. Brandon Carrillo does endorse some pack pain. Brandon Carrillo reports inability to get around Brandon Carrillo house for fear of falling. Lives alone, though Brandon Carrillo does have some children who live locally.  In the ED, Brandon Carrillo was HDS w/ unremarkable labs. Brandon Carrillo was disheveled and had reportedly urinated on himself. LCSW was consulted for disposition and the Brandon Carrillo was admitted to IMTS for observation.  Meds: Current Outpatient Prescriptions  Medication Sig Dispense Refill  . carbidopa-levodopa (SINEMET CR) 50-200 MG tablet Take 1 tablet by mouth at bedtime. 90 tablet 1  . carbidopa-levodopa (SINEMET IR) 25-250 MG tablet Take  2 tablets by mouth 4 (four) times daily. 720 tablet 1  . metFORMIN (GLUCOPHAGE XR) 500 MG 24 hr tablet Take 1 tablet (500 mg total) by mouth daily after supper. 90 tablet 3  . Multiple Vitamin (MULTIVITAMIN) tablet Take 1 tablet by mouth daily.    . pramipexole (MIRAPEX) 0.25 MG tablet Take 1 tablet (0.25 mg total) by mouth 3 (three) times daily. 270 tablet 1  . terbinafine (LAMISIL) 250 MG tablet Take 250 mg by mouth daily.    . pramipexole (MIRAPEX) 0.5 MG tablet TAKE 1 TABLET BY MOUTH 3 TIMES DAILY. (Brandon Carrillo not taking: Reported on 09/04/2016) 90 tablet 0   Allergies: Allergies as of 09/04/2016  . (No Known Allergies)   Past Medical History:  Diagnosis Date  . Depression   . Diabetes mellitus without complication (HCC)   . Hypercholesteremia   . Parkinson's disease (HCC)    Family History: Brandon Carrillo family history includes Diabetes in Brandon Carrillo mother.  Social History: Brandon Carrillo  reports that Brandon Carrillo has quit smoking. Brandon Carrillo has never used smokeless tobacco. Brandon Carrillo reports that Brandon Carrillo does not drink alcohol or use drugs.  Review of Systems: ROS per HPI and as below. Review of Systems  Constitutional: Positive for malaise/fatigue. Negative for chills, fever and weight loss.  Eyes: Negative for blurred vision.  Respiratory: Negative for cough and shortness of breath.   Cardiovascular: Negative for chest pain and leg swelling.  Gastrointestinal: Negative for abdominal pain, constipation, diarrhea, nausea and vomiting.  Genitourinary: Negative for  dysuria, frequency and urgency.  Musculoskeletal: Positive for back pain. Negative for myalgias.  Skin: Negative for rash.  Neurological: Positive for weakness. Negative for dizziness, tremors and headaches.  Endo/Heme/Allergies: Negative for polydipsia.  Psychiatric/Behavioral: The Brandon Carrillo is not nervous/anxious.    Physical Exam: Vitals:   09/04/16 1800 09/04/16 1815 09/04/16 1830 09/04/16 1845  BP: 123/70 122/70 123/68 117/73  Pulse: (!) 56 (!) 50 (!) 53 (!) 52    Resp: 13 13 11 11   Temp:      TempSrc:      SpO2: 100% 100% 100% 100%  Weight:      Height:       Physical Exam  Constitutional: Brandon Carrillo appears well-developed and well-nourished. Brandon Carrillo is cooperative. No distress.  HENT:  Head: Normocephalic and atraumatic.  Right Ear: Hearing normal.  Left Ear: Hearing normal.  Nose: Nose normal.  Mouth/Throat: Mucous membranes are normal.  Cardiovascular: Normal rate, regular rhythm, S1 normal, S2 normal and intact distal pulses.  Exam reveals no gallop.   No murmur heard. Pulmonary/Chest: Effort normal and breath sounds normal. No respiratory distress. Brandon Carrillo has no wheezes. Brandon Carrillo has no rhonchi. Brandon Carrillo has no rales. Brandon Carrillo exhibits no tenderness.  Abdominal: Soft. Normal appearance and bowel sounds are normal. Brandon Carrillo exhibits no ascites. There is no hepatosplenomegaly. There is no tenderness.  Musculoskeletal: Brandon Carrillo exhibits no deformity.  Neurological: Brandon Carrillo is alert. Brandon Carrillo has normal strength. Brandon Carrillo is disoriented (oriented to self only).  No pill rolling tremor or rigidity. Mild ataxia on FN testing and delay in RAM.  Skin: Skin is warm, dry and intact. Brandon Carrillo is not diaphoretic.  Psychiatric: Brandon Carrillo has a normal mood and affect. Brandon Carrillo speech is normal and behavior is normal.   Labs: CBC:  Recent Labs Lab 09/04/16 1149  WBC 9.4  HGB 13.3  HCT 40.9  MCV 94.0  PLT 341   Basic Metabolic Panel:  Recent Labs Lab 09/04/16 1149  NA 137  K 4.1  CL 107  CO2 22  GLUCOSE 107*  BUN 13  CREATININE 0.56*  CALCIUM 9.0   Cardiac Enzymes:  Recent Labs Lab 09/04/16 1149  CKTOTAL 133   Coagulation Studies:  Recent Labs  09/04/16 1149  LABPROT 13.7  INR 1.05   Liver Function Tests:  Recent Labs Lab 09/04/16 1149  AST 19  ALT 25  ALKPHOS 71  BILITOT 0.7  PROT 8.6*  ALBUMIN 3.7   CBG: Lab Results  Component Value Date   HGBA1C 6.5 (H) 09/16/2015   Urinalysis    Component Value Date/Time   COLORURINE STRAW (A) 09/04/2016 1137   APPEARANCEUR CLEAR 09/04/2016  1137   LABSPEC 1.006 09/04/2016 1137   PHURINE 6.0 09/04/2016 1137   GLUCOSEU NEGATIVE 09/04/2016 1137   HGBUR NEGATIVE 09/04/2016 1137   BILIRUBINUR NEGATIVE 09/04/2016 1137   KETONESUR NEGATIVE 09/04/2016 1137   PROTEINUR NEGATIVE 09/04/2016 1137   UROBILINOGEN 0.2 10/21/2013 1522   NITRITE NEGATIVE 09/04/2016 1137   LEUKOCYTESUR NEGATIVE 09/04/2016 1137   Imaging: EKG Interpretation  Date/Time:  Monday Sep 04 2016 13:25:24 EDT Ventricular Rate:  73 PR Interval:    QRS Duration: 99 QT Interval:  399 QTC Calculation: 440 R Axis:   17 Text Interpretation:  Sinus rhythm `no acute st/t changes Confirmed by Denton LankSTEINL  MD, Caryn BeeKEVIN ([id number]) on 09/04/2016 1:51:54 PM  Dg Chest Port 1 View Result Date: 09/04/2016 CLINICAL DATA:  Weakness for 3 days.  malaise EXAM: PORTABLE CHEST 1 VIEW COMPARISON:  May 2016 FINDINGS: There is slight left base  atelectasis. Lungs elsewhere are clear. Heart size and pulmonary vascularity are normal. No adenopathy. No bone lesions. IMPRESSION: Slight left base atelectasis.  No edema or consolidation. Electronically Signed   By: Bretta Bang III M.D.   On: 09/04/2016 11:55   Assessment & Plan by Problem: Principal Problem:   Generalized weakness Active Problems:   Parkinson's disease (HCC)   Diabetes type 2, controlled [facility])  Mr. Matai Carpenito is a 71 y.o. male with h/o DM and PD who present for generalized weakness and inability to care for himself at home.  1) Weakness: Appears sub-acute, concern for poor nutrition / hydration and inability to care for himself at home. Has been off Brandon Carrillo PD meds for several days. Protein and albumin good, electrolytes unremarkable, VSS. Will plan to restart PD meds and observe. Will appreciate CSW assistance for placement. - admit for obs - restart PD meds as below - CSW c/s for dispo and placement  2) Parkinson's Disease: Follows w/ [facility]. Has reportedly been off meds for 6 days. Will plan to  restart. - carbidopa/levodopa 25/250, 2 tablets at 6 AM/10 AM/2 PM/6 PM - carbidopa/levodopa 50/200 at bedtime - pramipexole 0.5 mg 3 times a day  3) DM: Last A1c 6.5 1 year ago. Currently on metformin at home, but off all meds recently x 1 wk. - SSI-m  DVT PPx - low molecular weight heparin  Code Status - Full  Dispo: Admit Brandon Carrillo to Observation with expected length of stay less than 2 midnights.  Signed: Carolynn Comment, MD 09/04/2016, 7:10 PM  Pager: [phone]

## 2016-09-04 NOTE — ED Notes (Signed)
Gave Pt a bedbath

## 2016-09-04 NOTE — ED Triage Notes (Signed)
Pt arrives from home via [facility] reporting generalized weakness x 3 days, excessive sweating and malaise.  Pt reports being out of parkinson's meds x 6 days, denies fever, fall, CP, LOC.  Oriented to self,place, situation.

## 2016-09-04 NOTE — ED Notes (Signed)
Admitting MD at bedside.

## 2016-09-04 NOTE — Telephone Encounter (Signed)
[facility] LCSW received phone call from [facility] LCSW, Enos Flingshley Jones that pt has presented to the ED today and now reporting that he does not have air conditioning. This CSW updated ED CSW that referral [facility] on 4/27 as [facility] felt referral was most appropriate for [facility] [employer]. CSW contacted [facility] for update and spoke to Ms. Berna SpareMarcus today who reports that some research will have to be done about referral as she is not seeing open referral in the system. CSW notified ED CSW of update. Discussed with ED CSW that CSW spoke with [employer] [facility] [employer] [facility] [place] who were familiar with pt and mainly communicate with pt at dental clinic, but did not have a lot to offer in the community. Per [employer], an upcoming dental clinic is scheduled for 5/30 and planned to contact pt regarding coming to clinic and hoped to connect with pt there.   CSW notified ED LCSW that this CSW has attempted to reach pt daughter since appointment without success. Notified that pt has orange card and receives primary care [facility].   Pt daughter has accompanied pt to appointments, but during appointment is unable to provide additional information about symptoms, etc. CSW provided ED CSW with contact number that pt daughter had provided in the past.   ED LCSW will notify this Clinic LCSW if any further collateral information is needed.   Loletta SpecterSuzanna Kidd, MSW, LCSW Clinical Social Worker [facility] [phone]

## 2016-09-05 ENCOUNTER — Telehealth: Payer: Self-pay | Admitting: Clinical

## 2016-09-05 DIAGNOSIS — Z597 Insufficient social insurance and welfare support: Secondary | ICD-10-CM

## 2016-09-05 DIAGNOSIS — I252 Old myocardial infarction: Secondary | ICD-10-CM

## 2016-09-05 LAB — GLUCOSE, CAPILLARY
Glucose-Capillary: 115 mg/dL — ABNORMAL HIGH (ref 65–99)
Glucose-Capillary: 95 mg/dL (ref 65–99)
Glucose-Capillary: 101 mg/dL — ABNORMAL HIGH (ref 65–99)
Glucose-Capillary: 190 mg/dL — ABNORMAL HIGH (ref 65–99)

## 2016-09-05 NOTE — Progress Notes (Signed)
CSW received call from MD to assist with pt disposition- medical team does not think pt in safe living environment and wants to see if appropriate for facility placement  CSW met with pt and discussed discharge planning via interpreter.  Patient states he lives in a trailer alone but has assistance from adult children and a friend who he states helps transport him to medical appointments and get his medications.  Patient states he is not a citizen of the [place] so would not be eligible for Medicaid.  Patient does not have any new acute medical needs so would also not be eligible for LOG placement at SNF.    Patient feels comfortable returning home but main concern is getting medications settled- pt is frustrated with his PCP because of issues with his medications- states he accidentally dropped some pills in water and MD was not helpful in getting more.  Also upset that some of his medications were changed by MD.  CSW making referral to In Home Aide services through [place]- left message on referral line.  Per notes patients Orange Card expired last month which assists with paying for his medications- CSW called [employer] [facility] and one of their coordinators is calling patient in the room to explain process for reapplying for [employer].  They also explained need for after hospital follow up appointment and suggested it be set up by hospital coordinator to get appointment sooner.  CSW updated MD and pt's Neurology CSW- per MD plan for DC tomorrow  Jorge Ny, LCSW Clinical Social Worker [phone]

## 2016-09-05 NOTE — Progress Notes (Signed)
09/04/16 2200 Admission history completed with help of interpreter Heidi ID #[id number]. Patient speaks Spanish,no family member at bedside. Yale Golla, Drinda Buttsharito Joselita, RCharity fundraiser

## 2016-09-05 NOTE — Evaluation (Signed)
Physical Therapy Evaluation Patient Details Name: Brandon Carrillo MRN: [id number] DOB: 08/29/1945 Today's Date: 09/05/2016   History of Present Illness  Mr. Prayan Ulin is a 71 y.o. male with a h/o of Parkinsons Disease (followed in [facility]) who presents with c/o generalized weakness, medication non-adherence, and inability to care for himself.  Clinical Impression  Pt admitted with above diagnosis. Pt currently with functional limitations due to the deficits listed below (see PT Problem List). Mr. Adron Bene presents to [place] a diffiult discharge situation; With Parkinsons, he would definitely benefit from follow-up PT (SNF, HHPT or Outpt), but with his insurance status, I don't know that any therapy follow up will be covered; Pt will benefit from skilled PT to increase their independence and safety with mobility to allow discharge to the venue listed below.       Follow Up Recommendations Other (comment) (would he qualify for Meals on Wheels? See also above)    Equipment Recommendations  None recommended by PT    Recommendations for Other Services OT consult     Precautions / Restrictions Precautions Precautions: Fall      Mobility  Bed Mobility                  Transfers Overall transfer level: Needs assistance Equipment used: Rolling walker (2 wheeled) Transfers: Sit to/from Stand Sit to Stand: Mod assist         General transfer comment: Mod assist to power up; cues for hand placement  Ambulation/Gait Ambulation/Gait assistance: Min assist Ambulation Distance (Feet): 200 Feet Assistive device: Rolling walker (2 wheeled) Gait Pattern/deviations: Decreased step length - right;Decreased step length - left;Trunk flexed     General Gait Details: Cues for posture and to keep RW close; noting tendency to speed up and RW getting too far ahead of him; he seemed to enjoy walking  Stairs            Wheelchair Mobility     Modified Rankin (Stroke Patients Only)       Balance Overall balance assessment: History of Falls;Needs assistance   Sitting balance-Leahy Scale: Good       Standing balance-Leahy Scale: Fair                               Pertinent Vitals/Pain Pain Assessment: 0-10 Pain Score: 5  Pain Location: bilateral lower legs Pain Descriptors / Indicators: Aching Pain Intervention(s): Monitored during session    Home Living Family/patient expects to be discharged to:: Private residence Living Arrangements: Children Available Help at Discharge: Other (Comment) (Pt reports children do not help him) Type of Home: House Home Access: Stairs to enter Entrance Stairs-Rails: None Entrance Stairs-Number of Steps: 4 Home Layout: One level Home Equipment: Walker - 2 wheels;Cane - single point Additional Comments: From chart review, I'm under the impression that pt lives in a camper or mobilie home on his son's property -- this will need to be verified    Prior Function Level of Independence: Independent with assistive device(s)         Comments: Though unable to manage his meds and ADLs leading to this admission     Hand Dominance        Extremity/Trunk Assessment   Upper Extremity Assessment Upper Extremity Assessment: Defer to OT evaluation (Noting occasional tremor)    Lower Extremity Assessment Lower Extremity Assessment: Generalized weakness    Cervical / Trunk Assessment Cervical / Trunk Assessment:  Other exceptions Cervical / Trunk Exceptions: head and shoulders forward posture  Communication   Communication: Prefers language other than English (Spanish)  Cognition Arousal/Alertness: Awake/alert Behavior During Therapy: WFL for tasks assessed/performed Overall Cognitive Status: Within Functional Limits for tasks assessed (for simple mobility tasks)                                        General Comments General comments (skin  integrity, edema, etc.): Wyvonnia DuskyGraciela Namihira present for Spanish Interpretation    Exercises     Assessment/Plan    PT Assessment Patient needs continued PT services  PT Problem List Decreased strength;Decreased range of motion;Decreased activity tolerance;Decreased balance;Decreased mobility;Decreased coordination;Decreased knowledge of use of DME;Decreased safety awareness;Decreased knowledge of precautions       PT Treatment Interventions DME instruction;Gait training;Stair training;Functional mobility training;Therapeutic activities;Therapeutic exercise;Balance training;Neuromuscular re-education;Cognitive remediation;Patient/family education    PT Goals (Current goals can be found in the Care Plan section)  Acute Rehab PT Goals Patient Stated Goal: did not state, but seemed to enjoy walking PT Goal Formulation: With patient Time For Goal Achievement: 09/19/16 Potential to Achieve Goals: Fair    Frequency Min 3X/week   Barriers to discharge Decreased caregiver support His home situation is difficult; not getting help from family; unable to cook consistently for himself, and family unreliable    Co-evaluation               AM-PAC PT "6 Clicks" Daily Activity  Outcome Measure Difficulty turning over in bed (including adjusting bedclothes, sheets and blankets)?: A Little Difficulty moving from lying on back to sitting on the side of the bed? : A Lot Difficulty sitting down on and standing up from a chair with arms (e.g., wheelchair, bedside commode, etc,.)?: Total Help needed moving to and from a bed to chair (including a wheelchair)?: A Lot Help needed walking in hospital room?: A Little Help needed climbing 3-5 steps with a railing? : A Little 6 Click Score: 14    End of Session Equipment Utilized During Treatment: Gait belt Activity Tolerance: Patient tolerated treatment well Patient left: in chair;with call bell/phone within reach;with chair alarm set;Other  (comment) (with voice set to Spanish) Nurse Communication: Mobility status PT Visit Diagnosis: Unsteadiness on feet (R26.81);Other abnormalities of gait and mobility (R26.89);Muscle weakness (generalized) (M62.81);History of falling (Z91.81)    Time: [id number] PT Time Calculation (min) (ACUTE ONLY): 34 min   Charges:   PT Evaluation $PT Eval Moderate Complexity: 1 Procedure PT Treatments $Gait Training: 8-22 mins   PT G Codes:   PT G-Codes **NOT FOR INPATIENT CLASS** Functional Assessment Tool Used: Clinical judgement Functional Limitation: Mobility: Walking and moving around Mobility: Walking and Moving Around Current Status (Y7829(G8978): At least 20 percent but less than 40 percent impaired, limited or restricted Mobility: Walking and Moving Around Goal Status [phone](G8979): 0 percent impaired, limited or restricted    Van ClinesHolly Moua Rasmusson, PT  Acute Rehabilitation Services Pager [phone] Office [phone]   Levi AlandHolly H Leetta Hendriks 09/05/2016, 5:08 PM

## 2016-09-05 NOTE — Progress Notes (Addendum)
   Subjective:  Spoke with the patient via telephone interpreter today. Patient reported that he has been having difficulty caring for himself at home due to the fact that he has been unable to obtain his medications regularly. Patient reports that when he takes his medications he is typically relatively functional able to perform most ADLs. However the patient is unable to provide for his meals regularly and is dependent on his children who are infrequently providing food for him. She also reports that he is unable to obtain his medications and is relying on his children to pick up his medications. Patient denies any financial barriers, but does report pain cashews for his medications. He notes frustration with his doctor due to difficulty with being unable to get his prescriptions filled when he runs out of his medications. No collateral is available at bedside.  Patient has no acute complaints, complains of chronic low back pain and knee pain. He feels mildly weak but significantly improved from yesterday. He answers all questions appropriately and does not appear confused.  Objective:  Vital signs in last 24 hours: Vitals:   09/04/16 1930 09/04/16 2006 09/05/16 0526 09/05/16 0929  BP: 124/67 (!) 169/85 121/72 (!) 101/54  Pulse: (!) 52 61 (!) 53 67  Resp: 12 16 17 18   Temp:  97.6 F (36.4 C) 98.7 F (37.1 C) 97.3 F (36.3 C)  TempSrc:  Oral  Oral  SpO2: 100% 99% 100% 96%  Weight:  168 lb (76.2 kg)    Height:  5\' 6"  (1.676 m)     Physical Exam  Constitutional: No distress.  Cardiovascular: Normal rate, regular rhythm and normal heart sounds.   Pulmonary/Chest: Effort normal and breath sounds normal.  Abdominal: Soft. Bowel sounds are normal. There is no tenderness.  Musculoskeletal: He exhibits no edema.   Assessment/Plan:  Principal Problem:   Generalized weakness Active Problems:   Parkinson's disease (HCC)   Diabetes type 2, controlled [facility](HCC)  [name] is a  71 y.o. male with h/o DM and PD who present for generalized weakness and inability to care for himself at home.  1) Weakness: appears 2/2 missing PD medications. Improved with restarting home meds. Significant concerns about ability to care for himself while living at home alone, inability to provide himself meals and pick up medications. Appreciate social work assistance with resources. - CSW c/s for dispo and placement  2) Parkinson's Disease: Follows w/ [facility]. Improved significantly with restarting home meds. - carbidopa/levodopa 25/250, 2 tablets at 6 AM/10 AM/2 PM/6 PM - carbidopa/levodopa 50/200 at bedtime - pramipexole 0.5 mg 3 times a day  3) DM: Last A1c 6.5 1 year ago. Home meds: Metformin - SSI-m  Dispo: Anticipated discharge when safe disposition is available.  Carolynn CommentStrelow, Ashlon Lottman, MD 09/05/2016, 11:44 AM Pager: [phone]

## 2016-09-05 NOTE — Telephone Encounter (Signed)
[facility] LCSW received call from [facility] at [facility], [place] who was following up to determine if CSW had received any communications from pt daughter since last visit.CSW discussed that CSW hasn't reached pt daughter since last visit. [facility] coordinator, Kandace ParkinsKelsey White reports that she has not received return call from pt daughter either. CSW updated that pt presented to ED yesterday and reported air conditioning out in home and pt out of medications. [facility] coordinator reports that in checking about pt upcoming dental clinic appointment it was learned that pt orange card expired on 07/31/16. [facility] willing to assist make connections to resources that may be able to assist pt once disposition from hospital is determined. CSW to continue to update [name] as this CSW learns of disposition from hospital.   Loletta SpecterSuzanna Ashante Snelling, MSW, LCSW Clinical Social Worker [facility] [phone]

## 2016-09-05 NOTE — Progress Notes (Signed)
Date: 09/05/2016  Patient name: Brandon Carrillo  Medical record number: [id number]  Date of birth: 08/02/1945   I have seen and evaluated Brandon Carrillo and discussed their care with the Residency Team. Brandon Carrillo is a 71 year old Spanish speaking gentleman who came to the the ED via EMS with chief complaint of weakness, diaphoresis, and malaise. I have read Dr Alm BustardStrelow's H&P and the team was able to get additional information today as his mental status has improved. The patient has had difficulty getting his Parkinson's medications on a timely manner. He is self pay as she has no insurance. This morning, he feels much better and agrees that he needs assistance with transportation, getting food, and getting medications.  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 The patient declined further workup including a catheterization and echo at that time. In a neurology note from September 2016, it was indicated that he was non- compliant with appointments. There is also note that his daughter threw his medications at him and he lost them. Other notes indicated that his church member friend whom he relied on for transportation and support had died and he had difficulty getting to appointments and is also grieving the friend's death. Notes indicated that although he had children he has had no contact with them for quite some time although other notes indicated a son and a daughter. There has been confusion over who was going to fill his Parkinson's medication leading to him running out of medications. Neurology was also getting limited supply after he had resumed his medications so that he would show up so they can evaluate how he was doing on the medications. In April 2017, he was referred to Meals on Wheels but ended up on a waiting list and had never received the meals which the patient found quite distressing. He had also been  referred to scat for transportation. He also requested a walker but due to lack of insurance, he had never gotten and a Child psychotherapistsocial worker had encouraged the patient and the daughter to find organizations that could offer free medical equipment.  PMHx : Parkinson's disease and diabetes Fam Hx his mother had diabetes Soc Hx : He lives alone independently on others for assistance getting food and medications from the pharmacy. He has a window fan but no air conditioning in his home. He has family including a son and a daughter-in-law that they do not come around on a regular basis. He apparently also has a daughter per chart review.   Vitals:   09/05/16 0526 09/05/16 0929  BP: 121/72 (!) 101/54  Pulse: (!) 53 67  Resp: 17 18  Temp: 98.7 F (37.1 C) 97.3 F (36.3 C)  NAD Skin : warm and dry, hyperpigmentation on the lower shins bilaterally. Circular areas of hypopigmentation within. No skin breakdown on buttocks. Neuro : No cogwheel rigidity, no pill-rolling tremor. Able to move all 4 extremities.  I personally viewed his 1 view AP CXR images and confirmed by reading with the official read. Rotated, no acute abnormalities.  I personally viewed his EKG and confirmed by reading with the official read. Sinus rhythm, normal axis, no ischemic changes.  Assessment and Plan: I have seen and evaluated the patient as outlined above. I agree with the formulated Assessment and Plan as detailed in the residents' note, with the following changes: Brandon Carrillo and is a 71 year old male with Parkinson's disease and insufficient community  support. He is not facile navigating the healthcare system in this country and does not have family support or community support to accomplish basic things like attending appointments to secure medication refills. He has had an appointment which his daughter attended in Aug 2017 to try to establish connection with community resources like Meals on Wheels and scat but that has  been overall unsuccessful. His family and church friends do not seem to be consistently reliable to ensure that the patient can live independently in his home. Additionally, the patient tells [place] he does not have air conditioning and there were notes in the chart about lack of adequate heat in the winter and cooling in the summer. I am very concerned about the patient's ability to successfully and safely live in his home environment as he does require assistance navigating healthcare system and meeting his ADLs. At this point, he does not have any payor for paid services and free community resources failed. The safest option would be to discharge to a nursing facility. This has not yet been broached with the patient but we will ask her to get more information from physical therapy, occupational therapy, and social work. Although we did not do a formal competency examination, he does appear to be competent to make his own decisions today so if he declines placement in a nursing facility there will be very little that we can do otherwise. If he were to refuse a facility, there will be very little free community resources that we can try to establish that hasn't already been tried.  1. Parkinson's disease - continue home medications. PT and OT consult  2. Diabetes - this diagnosis has been on a single A1c of 6.8 while hospitalized. It was not repeated prior to labeling him diabetic. However since diagnosis, he has had an A1c elevated at 6.5 which would point towards the diagnosis of diabetes. Regardless, his CBGs in the hospital have been well-controlled off his metformin. We will continue to hold the metformin and follow his sugar trend.  Dispo - will depend on PT and OT consult, social work, payor source, and patient preference. Would strongly urge patient to go to nursing facility.  Burns [place], MD 5/15/201811:26 AM

## 2016-09-05 NOTE — Care Management Note (Signed)
Case Management Note  Patient Details  Name: Brandon Carrillo MRN: [id number] Date of Birth: 11/12/1945  Subjective/Objective:      CM following for progression and d/c planning.               Action/Plan: 09/05/2016 Ongoing efforts by CSW, Eileen StanfordJenna H to arrange services for this pt . [facility]. Pt will need Orange Card updated and [facility]W. This CM [facility] no appointments available this afternoon. Will attempt again tomorrow.   Expected Discharge Date:                  Expected Discharge Plan:  Home/Self Care  In-House Referral:  Clinical Social Work  Discharge planning Services  CM Consult  Post Acute Care Choice:  NA Choice offered to:  NA  DME Arranged:    DME Agency:     HH Arranged:    HH Agency:     Status of Service:  In process, will continue to follow  If discussed at Long Length of Stay Meetings, dates discussed:    Additional Comments:  Starlyn SkeansRoyal, Toben Acuna U, RN 09/05/2016, 4:06 PM

## 2016-09-06 ENCOUNTER — Encounter: Payer: Self-pay | Admitting: Family Medicine

## 2016-09-06 LAB — GLUCOSE, CAPILLARY
GLUCOSE-CAPILLARY: 104 mg/dL — AB (ref 65–99)
Glucose-Capillary: 123 mg/dL — ABNORMAL HIGH (ref 65–99)
Glucose-Capillary: 163 mg/dL — ABNORMAL HIGH (ref 65–99)
Glucose-Capillary: 252 mg/dL — ABNORMAL HIGH (ref 65–99)

## 2016-09-06 LAB — CALCITRIOL (1,25 DI-OH VIT D): VIT D 1 25 DIHYDROXY: 38.9 pg/mL (ref 19.9–79.3)

## 2016-09-06 LAB — HEMOGLOBIN A1C
HEMOGLOBIN A1C: 6.2 % — AB (ref 4.8–5.6)
MEAN PLASMA GLUCOSE: 131 mg/dL

## 2016-09-06 MED ORDER — CARBIDOPA-LEVODOPA 25-250 MG PO TABS: 2.0000 | ORAL_TABLET | Freq: Four times a day (QID) | ORAL | 2 refills | Status: DC

## 2016-09-06 MED ORDER — PRAMIPEXOLE DIHYDROCHLORIDE 0.5 MG PO TABS: 0.5000 mg | ORAL_TABLET | Freq: Three times a day (TID) | ORAL | 2 refills | Status: DC

## 2016-09-06 MED ORDER — CARBIDOPA-LEVODOPA ER 50-200 MG PO TBCR: 1.0000 | EXTENDED_RELEASE_TABLET | Freq: Every day | ORAL | 2 refills | Status: DC

## 2016-09-06 MED ORDER — PRAMIPEXOLE DIHYDROCHLORIDE 0.25 MG PO TABS
0.5000 mg | ORAL_TABLET | Freq: Three times a day (TID) | ORAL | Status: DC
  Administered 2016-09-06 – 2016-09-07 (×3): 0.5 mg via ORAL
  Filled 2016-09-06 (×4): qty 2

## 2016-09-06 MED ORDER — PRAMIPEXOLE DIHYDROCHLORIDE 0.25 MG PO TABS
0.2500 mg | ORAL_TABLET | Freq: Once | ORAL | Status: AC
  Administered 2016-09-06: 0.25 mg via ORAL
  Filled 2016-09-06: qty 1

## 2016-09-06 MED FILL — PRAMIPEXOLE 0.5 MG TABLET: 0.5 | 30 days supply | Qty: 90 | Fill #0

## 2016-09-06 NOTE — Progress Notes (Signed)
   Subjective:  Pt looks much improved. Feels ok now that meds are restarted. Having tremors and some rigidity prior to AM med administration. Agrees to go home, but requests assistance with medication access to ensure no lapse in compliance. Will benefit from re-certification of [place] Card with possible assistance from [facility] for transport and with medications.  Objective:  Vital signs in last 24 hours: Vitals:   09/05/16 1743 09/05/16 2100 09/05/16 2220 09/06/16 0550  BP: 120/66 (!) 132/100    Pulse: (!) 56  63   Resp: 18 18    Temp: 97.4 F (36.3 C)  98.3 F (36.8 C)   TempSrc: Oral  Oral   SpO2: 100% 100%    Weight:    174 lb 6.1 oz (79.1 kg)  Height:       Physical Exam  Constitutional: No distress.  Cardiovascular: Normal rate, regular rhythm and normal heart sounds.   Pulmonary/Chest: Effort normal and breath sounds normal.  Abdominal: Soft. Bowel sounds are normal. There is no tenderness.  Musculoskeletal: He exhibits no edema.  Neurological:  Significant tremors BUE and rigidity throughout U/LE   Assessment/Plan:  Principal Problem:   Generalized weakness Active Problems:   Parkinson's disease (HCC)   Diabetes type 2, controlled [facility](HCC)  [name] is a 71 y.o. male with h/o DM and PD who present for generalized weakness and inability to care for himself at home.  1) Weakness: Resolved. Appears 2/2 missing PD medications.  2) Parkinson's Disease: [facility]. Improved significantly with restarting home meds. Hospital CSW working with [facility] CSW to obtain DSS resources for pt if available. Plan to DC w/ close f/u and will give 1 month refill of meds at DC w/ 2 refills as I feel risk of lapse in medications outweighs risk of loss to f/u. - carbidopa/levodopa 25/250, 2 tablets at 6 AM/10 AM/2 PM/6 PM - carbidopa/levodopa 50/200 at bedtime - pramipexole 0.5 mg 3 times a day  3) DM: Last A1c  6.5 1 year ago. Home meds: Metformin. BG controlled here w/o medication. Recommend d/c Metformin at DC to reduce overall med burden in pt with financial and social barriers.  Dispo: Anticipated discharge when safe disposition is available.  Brandon CommentStrelow, Brandon Mcdowell, MD 09/06/2016, 7:36 AM Pager: [phone]

## 2016-09-06 NOTE — Discharge Summary (Signed)
Name: Brandon Carrillo MRN: [id number] DOB: 01/29/1946 71 y.o. PCP: Dessa PhiFunches, Josalyn, MD  Date of Admission: 09/04/2016 10:53 AM Date of Discharge: 09/06/2016 Attending Physician: Burns SpainButcher, Elizabeth A, MD  Discharge Diagnosis: Principal Problem:   Generalized weakness Active Problems:   Parkinson's disease (HCC)   Diabetes type 2, controlled [facility](HCC)  Discharge Medications: Allergies as of 09/06/2016   No Known Allergies     Medication List    STOP taking these medications   metFORMIN 500 MG 24 hr tablet Commonly known as:  GLUCOPHAGE XR   terbinafine 250 MG tablet Commonly known as:  LAMISIL     TAKE these medications   carbidopa-levodopa 50-200 MG tablet Commonly known as:  SINEMET CR Take 1 tablet by mouth at bedtime.   carbidopa-levodopa 25-250 MG tablet Commonly known as:  SINEMET IR Take 2 tablets by mouth 4 (four) times daily.   multivitamin tablet Take 1 tablet by mouth daily.   pramipexole 0.5 MG tablet Commonly known as:  MIRAPEX Take 1 tablet (0.5 mg total) by mouth 3 (three) times daily. What changed:  See the new instructions.  Another medication with the same name was removed. Continue taking this medication, and follow the directions you see here.       Disposition and follow-up:   Brandon Carrillo [facility] St[facility].  At the hospital follow up visit please address:  1.  Parkinson's Disease: please ensure adherence to medications. Social Barriers to Care: attempt to provide resources through [employer] and [employer] for medication assistance and transportation to medical visits. DM: pt was previously on metformin with most recent A1c of 6.5. Given his borderline DM, I would recommend discontinuation of Metformin to reduce pill burden in the setting of significant financial and social barriers. Recommend continued lifestyle modifications.  Follow-up Appointments: Follow-up Information      [facility] Follow up.   Why:  Appointment : Tuesday, Sep 12, 2016 at 8:30 am for followup care and assistance with medications.  Contact information: [address] [place] [phone]       [facility]. Go on 09/07/2016.   Why:  Appointment at 3pm tomorrow for hospital follow up. Contact information: [address], [address] [place] [id number] [phone]          Hospital Course by problem list: Principal Problem:   Generalized weakness Active Problems:   Parkinson's disease (HCC)   Diabetes type 2, controlled (HCC)   1. Weakness 2/2 Parkinson's Disease w/ medication non-adherence d/t social and financial factors: Pt presented from home via EMS with significant weakness and some mild confusion. Patient was hemodynamically stable, with unremarkable laboratory evaluation. The patient had no significant external injury or abnormality on physical exam other than appearing disheveled and unkempt. Patient was mildly disoriented only to self and city. Patient was found unable to take care of himself having urinated on himself at home. Patient reports poor social support though he does have family in the area. He is undocumented and has no payor source. Patient was restarted on his Sinemet and pramipexole medications with significant improvement in his mental and functional status. Patient is still significantly disabled due to his underlying Parkinson's and has limitations in mobility and does not have the ability to perform ADLs safely at home. We consulted social work who reports that he will unfortunately not qualify for skilled nursing facility. Social work is reached out to [employer]  for further resources. PT and OT recommend acute rehabilitation in the inpatient setting. However, I do not feel the pt will be an good candidate d/t chronic nature of conditions. The pt would benefit most from  optimization of his home environment and ongoing, long-term support to help him with ADLs. Further, the pts primary issue currently is consistency of access to his Parkinson's medications.  One of the patient's most significant barriers is having his prescriptions refilled on a regular basis with inconsistent ability to follow-up at his doctor's visits. Given the rapid decline the pt experienced as a result of losing access to his medications resulting in his current hospitalization. He has very limited social and family support to help him accomplish this. Patient has overall low health literacy and lack of ability to function effectively within the [place] healthcare system. We have attempted to maximize resources available to him but unfortunately have limited options at this time. We were finally able to make contact with his immediate family (Son, Rulon Eisenmenger) on whose property his trailer home is located. They report that they care for the patient regularly, in spite of the fact that we were unable to contact them for several days. Furthermore, his condition on arrival do not support this. APS is aware of the situation, but have not taken action to this point. At the pt's request, he will be discharged back to him trailer home under the care of his family.  Discharge Vitals:   BP (!) 148/82 (BP Location: Right Arm)   Pulse 90   Temp 98.4 F (36.9 C) (Oral)   Resp 16   Ht 5\' 6"  (1.676 m)   Wt 174 lb 6.1 oz (79.1 kg)   SpO2 99%   BMI 28.15 kg/m   Pertinent Labs, Studies, and Procedures: As above.  Procedures Performed:  Dg Chest Port 1 View Result Date: 09/04/2016 CLINICAL DATA:  Weakness for 3 days.  malaise EXAM: PORTABLE CHEST 1 VIEW COMPARISON:  May 2016 FINDINGS: There is slight left base atelectasis. Lungs elsewhere are clear. Heart size and pulmonary vascularity are normal. No adenopathy. No bone lesions. IMPRESSION: Slight left base atelectasis.  No edema or consolidation. Electronically Signed    By: Bretta Bang III M.D.   On: 09/04/2016 11:55   Discharge Instructions: Discharge Instructions    Call MD for:  extreme fatigue    Complete by:  As directed    Call MD for:  persistant dizziness or light-headedness    Complete by:  As directed    Call MD for:  persistant nausea and vomiting    Complete by:  As directed    Diet - low sodium heart healthy    Complete by:  As directed    Discharge instructions    Complete by:  As directed    We will prescribe you a 3 months supply of your Parkinson's medications. It will be very important that you continue to follow up with your Primary Doctor and with your Neurologists so that we can monitor your symptoms and doses.  Le recetar un suministro de 3 meses de sus medicamentos para Geologist, engineering. Ser muy importante que contine haciendo un seguimiento con su mdico de atencin primaria y con sus neurlogos para que podamos controlar sus sntomas y dosis.   Increase activity slowly    Complete by:  As directed       Signed: Carolynn Comment, MD 09/06/2016, 1:33 PM   Pager: [phone]

## 2016-09-06 NOTE — Clinical Social Work Note (Signed)
Request made by MD today for possible assistance with transport to a medical appointment on Thursday [facility] at 3 pm. CSW attempted 3 times to reach: 1. Patient's daughter Bernadene BellKarina Flores [phone]) and no voice mail. 2. Son Calla KicksMario Carrena - [phone] and no voice mail. 3. Son Hubbard HartshornFelix Carreno - [phone] and no voice mail. 4. Kenney HousemanMiquel - friend - [phone]: male answered the phone and said [place] died.  APS social workers Okey Regalarol and Allyn KennerRhonda Anderson came to see SW today regarding report they received and also visited briefly with patient. Written request made for medical information and APS workers provided with H&P and most recent PT/OT evaluations.  Genelle BalVanessa Magdaline Zollars, MSW, LCSW Licensed Clinical Social Worker Clinical Social Work Department [facility] [phone]

## 2016-09-06 NOTE — Progress Notes (Addendum)
Rehab Admissions Coordinator Note:  Patient was screened by Brandon Carrillo, Brandon Carrillo for appropriateness for an Inpatient Acute Rehab Consult per PT and OT change in recommendations.s At this time, we are recommending Inpatient Rehab consult. I discussed with Dr. Marinda ElkStrelow and he would like to see with med adjustments today, for therapy to see pt tomorrow. Pt has a Neurology appointment as an outpt tomorrow.  Brandon Carrillo, Brandon Carrillo 09/06/2016, 2:01 PM  I can be reached at [phone].

## 2016-09-06 NOTE — Patient Instructions (Addendum)
  Pectoralis Stretch With Scapula Adduction: Supine (Towel Roll)    Acostado sobre un rollo de toalla vertical. Suavemente junte los omoplatos. Realice ___ veces, ___ veces por da.       Elbow Back     Coloque las manos atrs de la cabeza y lleve los codos hacia atrs lo ms que pueda. Mantenga ____ segundos mientras cuenta en voz alta. Repita ____ veces. Haga ____ sesiones por da.

## 2016-09-06 NOTE — Progress Notes (Signed)
Physical Therapy Treatment Patient Details Name: Brandon Carrillo MRN: [id number] DOB: 11/28/1945 Today's Date: 09/06/2016    History of Present Illness [name] is a 71 y.o. male with a h/o of Parkinsons Disease (followed in [facility]) who presents with c/o generalized weakness, medication non-adherence, and inability to care for himself.    PT Comments    Difficult dc situation; Needing much more assist today for functional mobility due to significantly incr tremors today compared to yesterday; At this point I believe it is necessary to pursue post-acute rehabilitation to maximize independence and safety with mobility, and establish a consistent exercise program to mitigate effects of Parkinson's disease prior to dc home; the sticking point may be dispo, and he will need to be modified independent to dc home safely; Still, with intensive therapies and consistent medication, I believe this can be achieved; Ashby DawesGraciela, Spanish Interpreter was present entire session; Discussed above with Internal Medicine Docs  Mr. Adron BeneCarreno-Vasquez voiced concerns about his care; Informed the Charge Nurse  Follow Up Recommendations  CIR     Equipment Recommendations  Rolling walker with 5" wheels;3in1 (PT)    Recommendations for Other Services OT consult     Precautions / Restrictions Precautions Precautions: Fall Restrictions Weight Bearing Restrictions: No    Mobility  Bed Mobility Overal bed mobility: Needs Assistance Bed Mobility: Sit to Supine     Supine to sit: Min guard Sit to supine: Min assist   General bed mobility comments: Min assist to help LEs into bed  Transfers Overall transfer level: Needs assistance Equipment used: Rolling walker (2 wheeled) Transfers: Sit to/from [employer];Stand Pivot Transfers Sit to Stand: Mod assist Stand pivot transfers: Mod assist       General transfer comment: Mod assist to power up from chair, and steady during  transition back to bed  Ambulation/Gait             General Gait Details: Unable today due to significant tremors   Stairs            Wheelchair Mobility    Modified Rankin (Stroke Patients Only)       Balance Overall balance assessment: History of Falls;Needs assistance Sitting-balance support: Feet supported;Bilateral upper extremity supported Sitting balance-Leahy Scale: Fair     Standing balance support: Bilateral upper extremity supported Standing balance-Leahy Scale: Poor Standing balance comment: RW for support                            Cognition Arousal/Alertness: Awake/alert Behavior During Therapy: WFL for tasks assessed/performed Overall Cognitive Status: Difficult to assess (but appears WFL for basic tasks) Area of Impairment: Memory                     Memory: Decreased short-term memory         General Comments: Repeating himself frequently regarding getting PD meds this morning. Pt reports he gets meds at 10am; explained to pt that it is not time yet (as it was 9:20) and pt continues to request medication.       Exercises Other Exercises Other Exercises: Performed scapular retraction exercise and chest opening stretch x 3 reps with 5 breath hold Other Exercises: supine towel roll chest opening stretch with 10 minute hold    General Comments        Pertinent Vitals/Pain Pain Assessment: Faces Faces Pain Scale: Hurts little more Pain Location: R wrist at IV site Pain Descriptors /  Indicators: Discomfort;Sore Pain Intervention(s): Monitored during session    Home Living Family/patient expects to be discharged to:: Private residence Living Arrangements: Alone Available Help at Discharge: Other (Comment) (pt reports none) Type of Home: House Home Access: Stairs to enter Entrance Stairs-Rails: None Home Layout: One level Home Equipment: Environmental consultant - 2 wheels;Cane - single point      Prior Function Level of  Independence: Independent with assistive device(s)      Comments: Pt reports he uses a cane for mobility inside the house and RW for mobility outside. Depending on how he is feeling he will get in the bath tub for bathing vs sponge bathing. Pt reports he often drops his food on the floor due to bil UE tremors and has to "eat off the floor like a dog".   PT Goals (current goals can now be found in the care plan section) Acute Rehab PT Goals Patient Stated Goal: get PD meds PT Goal Formulation: With patient Time For Goal Achievement: 09/19/16 Potential to Achieve Goals: Fair Progress towards PT goals: Not progressing toward goals - comment (Significant tremors today)    Frequency    Min 3X/week      PT Plan Other (comment);Discharge plan needs to be updated (Difficutl dc plan situation)    Co-evaluation              AM-PAC PT "6 Clicks" Daily Activity  Outcome Measure  Difficulty turning over in bed (including adjusting bedclothes, sheets and blankets)?: A Little Difficulty moving from lying on back to sitting on the side of the bed? : A Lot Difficulty sitting down on and standing up from a chair with arms (e.g., wheelchair, bedside commode, etc,.)?: Total Help needed moving to and from a bed to chair (including a wheelchair)?: A Lot Help needed walking in hospital room?: A Lot Help needed climbing 3-5 steps with a railing? : Total 6 Click Score: 11    End of Session Equipment Utilized During Treatment: Gait belt Activity Tolerance: Patient tolerated treatment well Patient left: in bed;with bed alarm set;Other (comment) (With ) Nurse Communication: Mobility status PT Visit Diagnosis: Unsteadiness on feet (R26.81);Other abnormalities of gait and mobility (R26.89);Muscle weakness (generalized) (M62.81);History of falling (Z91.81)     Time: 0938-1000 PT Time Calculation (min) (ACUTE ONLY): 22 min  Charges:  $Therapeutic Activity: 8-22 mins                    G Codes:        Van Clines, PT  Acute Rehabilitation Services Pager [phone] Office [phone]    Levi Aland 09/06/2016, 11:58 AM
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
## 2016-09-06 NOTE — Evaluation (Signed)
Occupational Therapy Evaluation Patient Details Name: Brandon Carrillo MRN: [id number] DOB: 31-Jul-1945 Today's Date: 09/06/2016    History of Present Illness Mr. Jiaire Rosebrook is a 71 y.o. male with a h/o of Parkinsons Disease (followed in [facility]) who presents with c/o generalized weakness, medication non-adherence, and inability to care for himself.   Clinical Impression   Pt reports he was managing ADL at mod I level PTA with difficulty due to bil UE tremors. Currently pt requires mod assist overall for ADL and min assist for stand pivot transfers. Pt presenting with decreased short term memory, poor balance, bil UE tremors, and generalized weakness impacting his independence and safety with ADL and functional mobility. Pt would strongly benefit from post acute rehab to maximize independence and safety with ADL and functional mobility prior to return home; recommending CIR at this time. Pt would benefit from continued skilled OT to address established goals.    Follow Up Recommendations  CIR;Supervision/Assistance - 24 hour    Equipment Recommendations  3 in 1 bedside commode;Tub/shower seat    Recommendations for Other Services Rehab consult     Precautions / Restrictions Precautions Precautions: Fall Restrictions Weight Bearing Restrictions: No      Mobility Bed Mobility Overal bed mobility: Needs Assistance Bed Mobility: Supine to Sit     Supine to sit: Min guard     General bed mobility comments: Increased time required. HOB slightly elevated with use of bed rail. Min guard for safety due to unsteadiness and tremors.  Transfers Overall transfer level: Needs assistance Equipment used: Rolling walker (2 wheeled) Transfers: Sit to/from [employer] Sit to Stand: Mod assist Stand pivot transfers: Min assist       General transfer comment: Mod assist to boost up from EOB with blocking feet. Once in standing pt requiring min  assist for pivot to chair with improvement in tremors.    Balance Overall balance assessment: History of Falls;Needs assistance Sitting-balance support: Feet supported;Bilateral upper extremity supported Sitting balance-Leahy Scale: Fair     Standing balance support: Bilateral upper extremity supported Standing balance-Leahy Scale: Poor Standing balance comment: RW for support                           ADL either performed or assessed with clinical judgement   ADL Overall ADL's : Needs assistance/impaired Eating/Feeding: Moderate assistance;Sitting   Grooming: Moderate assistance;Sitting   Upper Body Bathing: Moderate assistance;Sitting   Lower Body Bathing: Moderate assistance;Sit to/from stand   Upper Body Dressing : Minimal assistance;Sitting   Lower Body Dressing: Moderate assistance;Sit to/from stand   Toilet Transfer: Minimal assistance;Stand-pivot;BSC;RW Toilet Transfer Details (indicate cue type and reason): Simulated by stand pivot EOB > chair. Discussed uses of 3 in 1 for home; over toilet and in tub as a seat.         Functional mobility during ADLs: Minimal assistance;Rolling walker (for stand pivot only) General ADL Comments: Pt initially reporting dizziness with supine to sit; resolved sitting EOB ~1 minute. Pt c/o not recieving PD meds this AM with increased bil UE tremors; pt reports tremors at baseline prior to meds.     Vision         Perception     Praxis      Pertinent Vitals/Pain Pain Assessment: Faces Faces Pain Scale: Hurts little more Pain Location: R wrist at IV site Pain Descriptors / Indicators: Discomfort;Sore Pain Intervention(s): Monitored during session;Repositioned     Hand  Dominance Right   Extremity/Trunk Assessment Upper Extremity Assessment Upper Extremity Assessment: Generalized weakness (bil UE resting tremor)   Lower Extremity Assessment Lower Extremity Assessment: Defer to PT evaluation   Cervical /  Trunk Assessment Cervical / Trunk Assessment: Other exceptions Cervical / Trunk Exceptions: head and shoulders forward posture   Communication Communication Communication: Prefers language other than [place];Interpreter utilized (Spanish speaking-Graciela interpreting)   Cognition Arousal/Alertness: Awake/alert Behavior During Therapy: WFL for tasks assessed/performed Overall Cognitive Status: Difficult to assess (but appears WFL for basic tasks) Area of Impairment: Memory                     Memory: Decreased short-term memory         General Comments: Repeating himself frequently regarding getting PD meds this morning. Pt reports he gets meds at 10am; explained to pt that it is not time yet (as it was 9:20) and pt continues to request medication.    General Comments       Exercises     Shoulder Instructions      Home Living Family/patient expects to be discharged to:: Private residence Living Arrangements: Alone Available Help at Discharge: Other (Comment) (pt reports none) Type of Home: House Home Access: Stairs to enter [employer] of Steps: 4 Entrance Stairs-Rails: None Home Layout: One level     Bathroom Shower/Tub: Chief Strategy Officer: Standard     Home Equipment: Environmental consultant - 2 wheels;Cane - single point          Prior Functioning/Environment Level of Independence: Independent with assistive device(s)        Comments: Pt reports he uses a cane for mobility inside the house and RW for mobility outside. Depending on how he is feeling he will get in the bath tub for bathing vs sponge bathing. Pt reports he often drops his food on the floor due to bil UE tremors and has to "eat off the floor like a dog".        OT Problem List: Decreased strength;Decreased range of motion;Decreased activity tolerance;Impaired balance (sitting and/or standing);Decreased coordination;Decreased cognition;Decreased knowledge of use of DME or  AE;Impaired UE functional use      OT Treatment/Interventions: Self-care/ADL training;Therapeutic exercise;Energy conservation;DME and/or AE instruction;Neuromuscular education;Therapeutic activities;Patient/family education;Balance training    OT Goals(Current goals can be found in the care plan section) Acute Rehab OT Goals Patient Stated Goal: get PD meds OT Goal Formulation: With patient Time For Goal Achievement: 09/20/16 Potential to Achieve Goals: Good ADL Goals Pt Will Perform Eating: with modified independence;with adaptive utensils;sitting Pt Will Perform Grooming: standing;with supervision Pt Will Perform Upper Body Bathing: with modified independence;sitting Pt Will Perform Lower Body Bathing: sit to/from stand;with supervision Pt Will Transfer to Toilet: with supervision;ambulating;bedside commode (over toilet) Pt Will Perform Toileting - Clothing Manipulation and hygiene: with supervision;sit to/from stand Pt Will Perform Tub/Shower Transfer: Tub transfer;with supervision;ambulating;3 in 1;shower seat;rolling walker  OT Frequency: Min 3X/week   Barriers to D/C: Decreased caregiver support  pt lives alone with limited family support       Co-evaluation              AM-PAC PT "6 Clicks" Daily Activity     Outcome Measure Help from another person eating meals?: A Lot Help from another person taking care of personal grooming?: A Lot Help from another person toileting, which includes using toliet, bedpan, or urinal?: A Lot Help from another person bathing (including washing, rinsing, drying)?: A Lot Help  from another person to put on and taking off regular upper body clothing?: A Lot Help from another person to put on and taking off regular lower body clothing?: A Lot 6 Click Score: 12   End of Session Equipment Utilized During Treatment: Gait belt;Rolling walker Nurse Communication: Other (comment) (pt requesting PD meds)  Activity Tolerance: Patient tolerated  treatment well Patient left: in chair;with call bell/phone within reach  OT Visit Diagnosis: Other abnormalities of gait and mobility (R26.89);Repeated falls (R29.6);Muscle weakness (generalized) (M62.81)                Time: [id number] OT Time Calculation (min): 27 min Charges:  OT General Charges $OT Visit: 1 Procedure OT Evaluation $OT Eval Moderate Complexity: 1 Procedure OT Treatments $Self Care/Home Management : 8-22 mins G-Codes:     Fredric MareBailey A. Brett Albinooffey, M.S., OTR/L Pager: [phone]  Gaye AlkenBailey A Johnella Crumm 09/06/2016, 10:15 AM

## 2016-09-07 ENCOUNTER — Telehealth: Payer: Self-pay | Admitting: Neurology

## 2016-09-07 ENCOUNTER — Ambulatory Visit: Payer: Self-pay | Admitting: Neurology

## 2016-09-07 ENCOUNTER — Telehealth: Payer: Self-pay | Admitting: Clinical

## 2016-09-07 LAB — GLUCOSE, CAPILLARY
GLUCOSE-CAPILLARY: 178 mg/dL — AB (ref 65–99)
Glucose-Capillary: 125 mg/dL — ABNORMAL HIGH (ref 65–99)
Glucose-Capillary: 124 mg/dL — ABNORMAL HIGH (ref 65–99)
Glucose-Capillary: 118 mg/dL — ABNORMAL HIGH (ref 65–99)

## 2016-09-07 MED ORDER — RAMELTEON 8 MG PO TABS
8.0000 mg | ORAL_TABLET | Freq: Every day | ORAL | Status: DC
  Administered 2016-09-07 – 2016-09-10 (×4): 8 mg via ORAL
  Filled 2016-09-07 (×4): qty 1

## 2016-09-07 MED ORDER — SENNOSIDES-DOCUSATE SODIUM 8.6-50 MG PO TABS
1.0000 | ORAL_TABLET | Freq: Two times a day (BID) | ORAL | Status: DC
  Administered 2016-09-07 – 2016-09-09 (×4): 1 via ORAL
  Filled 2016-09-07 (×5): qty 1

## 2016-09-07 MED ORDER — PRAMIPEXOLE DIHYDROCHLORIDE 0.25 MG PO TABS
0.2500 mg | ORAL_TABLET | Freq: Three times a day (TID) | ORAL | Status: DC
  Administered 2016-09-07 – 2016-09-11 (×12): 0.25 mg via ORAL
  Filled 2016-09-07 (×13): qty 1

## 2016-09-07 NOTE — Telephone Encounter (Signed)
Spoke with resident physician Carolynn CommentBryan Strelow earlier today.  Discussed social situation as I have noted it over last few years.  pts church used to be very supportive and may be source of support in future.  Talked to him about fact pt does well when has access to his medication.  Meds have been written in 90 day supplies in the past and lengthy discussion with pharmacy last visit to clear up any med errors and cleared out all scripts except ones that were correct dosages for PD meds.  Advised against increased mirapex due to hallucinations, especially since patient living alone as these can be very dangerous and patient can become psychotic and start acting out on them.

## 2016-09-07 NOTE — Telephone Encounter (Signed)
Received phone call from Dr. Carolynn CommentBryan Strelow regarding pt. Pt remains in hospital. MD reaching out to this CSW to inquire if CSW has any additional contacts for family. CSW discussed that CSW does not have any further contacts than listed in EPIC. Dr. Marinda ElkStrelow was hoping to reach family to be able to assist with pt discharge home and see Dr. Arbutus Leasat upon discharge, but attempts to reach family have been unsuccessful. CSW dicussed that pt daughter, Bernadene BellKarina Flores has been present during office visit. However, CSW has not been able to reach pt daughter following last appointment. CSW discussed that multiple resources have been explored, but payor-source and unreliable family support have been barriers. CSW discussed that CSW will attempt to reach pt family and follow up with [facility] to confirm that they do not have any additional contacts on file.   CSW used Spanish interpreter line to contact pt family members, but unable to reach.   1. Patient's daughter Bernadene BellKarina Flores [phone]) and no voice mail. 2. Son Calla KicksMario Carrena - [phone] and no voice mail. 3. Son Hubbard HartshornFelix Carreno - [phone] and no voice mail.  CSW contacted Kandace ParkinsKelsey White, Landproject coordinator at [name] and compared contact phone numbers and [facility] did not have any other contacts for pt family. Adelina MingsKelsey discussed that she will reach out to some of her colleagues connected in the Latino community to see if anyone may know how to reach the family to develop a safe plan for pt.   CSW paged Dr. Marinda ElkStrelow and updated that this CSW unable to reach pt family and [facility] will notify this CSW if they identify any additional contacts for family or friends from church.   Hospital CSW following for disposition planning.  Loletta SpecterSuzanna Simona Rocque, MSW, LCSW Clinical Social Worker [facility] [phone]

## 2016-09-07 NOTE — Progress Notes (Addendum)
Subjective:  Pt continues to have significant pill-rolling and rigidity, though mildly improved today compared with yesterday. Reports that current symptoms are consistent with level of symptoms he was experiencing at home prior to losing access to medications.  Social work has been unable to contact any of the pt's relatives with all numbers on file. Pt does endorse some support from his church, but is unable to recall the specific name or phone number for these persons.  Objective:  Vital signs in last 24 hours: Vitals:   09/06/16 1015 09/06/16 1642 09/06/16 2201 09/07/16 0404  BP: (!) 148/82 122/64 124/62 (!) 127/96  Pulse: 90 61 (!) 58 (!) 57  Resp: 16 16 16 16   Temp: 98.4 F (36.9 C) 98.9 F (37.2 C)  98.2 F (36.8 C)  TempSrc: Oral Oral Oral Oral  SpO2: 99% 99% 100% 98%  Weight:   176 lb 5.9 oz (80 kg)   Height:       Physical Exam  Constitutional: No distress.  Cardiovascular: Normal rate, regular rhythm and normal heart sounds.   Pulmonary/Chest: Effort normal and breath sounds normal.  Abdominal: Soft. Bowel sounds are normal. There is no tenderness.  Musculoskeletal: He exhibits no edema.  Neurological:  Significant tremors BUE and rigidity throughout U/LE   Assessment/Plan:  Principal Problem:   Generalized weakness Active Problems:   Parkinson's disease (HCC)   Diabetes type 2, controlled [facility](HCC)  [name] is a 71 y.o. male with h/o DM and PD who present for generalized weakness and inability to care for himself at home.  1) Parkinson's Disease: [facility], Dr. Arbutus Leasat. Improved MS and weakness with restarting meds, but then had worsening rigidity and cog-wheeling, significant tremor. Increased dose of Pramipexole to 0.5 TID (was changed recently to 0.25 TID d/t hallucinations). Appears to have mild improvement in symptoms today. Will need continued f/u with Dr. Arbutus Leasat for management. Still concerned about ability  to live safely at home w/o support. No family has been reachable this hospitalization in spite of significant CSW efforts. Do not feel that CIR will significantly benefit the pt in the long term d/t chronic progressive nature of his d/o that will require consistent medication access and close f/u and monitoring which are his two primary barriers. Pt will also not have any safe disposition from CIR after therapy is complete. His primary need is for daily support with ADLs and IADLs which cannot be achieved with inpatient rehab without continue auxiliary resources.  ADDENDUM: s/w Dr. Arbutus Leasat who feels risk of hallucinations on higher dose of Pramipexole outweighs any benefits. Will decrease to 0.25 TID again. Also notes that pt does seem to preform well with stable access to medications, which she also endorses as his primary issue. She does not feel that titration of his medications is likely his primary issue at this point, but rather, consistent access and adherence + help with ADLs. She has worked very hard to attempt to resolve issues with med distribution at his Pharmacy, however, he is still dependent on unreliable assistance from family to obtain these medications. He would definitely benefit from mail-order pharmacy, but w/o payor-source will not qualify. Free mail-order [facility] unfortunately does not carry Sinemet on their formulary.  - carbidopa/levodopa 25/250, 2 tablets at 6 AM/10 AM/2 PM/6 PM - carbidopa/levodopa 50/200 at bedtime - pramipexole 0.5 mg 3 times a day  Dispo: Anticipated discharge when safe disposition is available.  Carolynn CommentStrelow, Darcey Cardy, MD 09/07/2016, 11:00 AM  Pager: [phone]

## 2016-09-08 ENCOUNTER — Telehealth: Payer: Self-pay | Admitting: Clinical

## 2016-09-08 LAB — GLUCOSE, CAPILLARY
GLUCOSE-CAPILLARY: 216 mg/dL — AB (ref 65–99)
Glucose-Capillary: 110 mg/dL — ABNORMAL HIGH (ref 65–99)
Glucose-Capillary: 117 mg/dL — ABNORMAL HIGH (ref 65–99)

## 2016-09-08 MED ORDER — DOCUSATE SODIUM 100 MG PO CAPS: 100.0000 mg | ORAL_CAPSULE | Freq: Every day | ORAL | Status: DC

## 2016-09-08 MED ORDER — POLYETHYLENE GLYCOL 3350 17 G PO PACK
17.0000 g | PACK | Freq: Every day | ORAL | Status: DC
Start: 2016-09-08 — End: 2016-09-11
  Administered 2016-09-08 – 2016-09-10 (×3): 17 g via ORAL
  Filled 2016-09-08 (×3): qty 1

## 2016-09-08 MED ORDER — ACETAMINOPHEN 325 MG PO TABS
650.0000 mg | ORAL_TABLET | Freq: Four times a day (QID) | ORAL | Status: DC | PRN
  Administered 2016-09-08 – 2016-09-09 (×2): 650 mg via ORAL
  Filled 2016-09-08 (×3): qty 2

## 2016-09-08 MED ORDER — ONDANSETRON 4 MG PO TBDP
4.0000 mg | ORAL_TABLET | Freq: Three times a day (TID) | ORAL | Status: DC | PRN
Start: 2016-09-08 — End: 2016-09-11
  Administered 2016-09-08: 4 mg via ORAL
  Filled 2016-09-08: qty 1

## 2016-09-08 NOTE — Clinical Social Work Note (Addendum)
CSW talked with patient at the bedside with interpreter Graciella present.  Patient reported that his daughter-in-law (wife of son Brandon Carrillo) called ambulance and Brandon Carrillo and wife live nearby. [name] indicated that Brandon Carrillo and his wife are the only family that help him out. Patient unable to provide phone numbers as he does not know where his phone is. Per patient he thought he had it with him when brought to hospital, however it is not in the bag with his clothing.  Patient reported that he goes to [employer] and a lady named Brandon [employer]illo from the [employer] helps him out. Patient explained that a neighbor helps supply him with water - he puts the jugs out and the neighbor will fill them up for him. Patient does not have running water in his camper and uses plastic bags to capture his urine and feces and then throws them out.  Patient gets food 1 time per day from "the government" and added that if he is hungry, he will ask a neighbor for food and sometimes his daughter-in-law will bring him food or his son Brandon Carrillo will give him a Taco. Patient reported that in the past he has eaten the husk of a banana. Patient lives in a small camper and does not lights, but not running water and his son Brandon Carrillo pays the [employer] bill.     [name] reported that his neighbor Brandon Carrillo help him get his meds, and is authorized to pick-up his meds if needed. [facility] Patient explained that his children don;t like Brandon Carrillo, however she does not care and helps him anyway. Patient became tearful at one point during the conversation and expressed that "no one has come looking for him", meaning family has not come to the hospital to see about him.  CSW later received a call from colleague Brandon Carrillo regarding patient. She provided CSW with 2 phone numbers: Brandon Carrillo - [phone] and Brandon [place]illo' - son who lives in [place] [phone]- [phone]  (not sure if he speaks English or not). TC to son Brandon Carrillo' (4:55 pm) and he requested an interpreter. CSW will make contact again with son with an interpreter.   At 5:11 pm CSW contacted [employer] [phone]) and spoke with Isai [phone] and then connected patient's son Brandon [place] Son reported that he sometimes talks with his father by phone and did not know he was in the hospital. He does talk with his siblings in [place] and indicated that they have not expressed any concerns about their dad. He last talked with his dad 4 or 5 days ago. When asked, Brandon Carrillo' does not remember if his dad has talked with him about his living situation and he is not aware of any needs (food or otherwise) that his dad may have. When asked, Brandon Carrillo' indicated that he would be able to assist his dad monetarily if needed. Brandon Carrillo' then told interpreter that he was at work and could not talk any further. He asked to be called between 9 am - 1 pm and CSW advised son that he would be re contacted next week during that time frame.  CSW will continue to work on supports for patient at discharge and re-contact son Brandon Carrillo' and the [employer] patient attends next week.   Brandon Carrillo, MSW, LCSW Licensed Clinical Social Worker Clinical Social Work Department [facility] [phone]

## 2016-09-08 NOTE — Progress Notes (Signed)
Occupational Therapy Treatment Patient Details Name: Brandon Carrillo MRN: [id number] DOB: 02/16/1946 Today's Date: 09/08/2016    History of present illness [name] is a 71 y.o. male with a h/o of Parkinsons Disease (followed in [facility]) who presents with c/o generalized weakness, medication non-adherence, and inability to care for himself.   OT comments  Pt with significant improvements in functional independence and mobility today. Decreased bil UE resting tremors compared to last session. Pt able to self feed with set up and use of regular utensils; educated on strategies for self feeding when UE tremors increased. Pt able to perform grooming tasks x2 stand at the sink and complete functional mobility with min guard assist. D/c plan remains appropriate. Will continue to follow acutely.   Follow Up Recommendations  CIR;Supervision/Assistance - 24 hour    Equipment Recommendations  3 in 1 bedside commode;Tub/shower seat    Recommendations for Other Services Rehab consult    Precautions / Restrictions Precautions Precautions: Fall Restrictions Weight Bearing Restrictions: No       Mobility Bed Mobility               General bed mobility comments: Pt OOB in chair upon arrival  Transfers Overall transfer level: Needs assistance Equipment used: Rolling walker (2 wheeled) Transfers: Sit to/from Stand Sit to Stand: Min guard         General transfer comment: x2 attempts needed for sit to stand from chair but pt able to boost up without physical assist. Good hand placement and technique    Balance Overall balance assessment: Needs assistance   Sitting balance-Leahy Scale: Good     Standing balance support: No upper extremity supported;During functional activity Standing balance-Leahy Scale: Fair Standing balance comment: Able to stand at sink and wash hands without UE upport                           ADL either  performed or assessed with clinical judgement   ADL Overall ADL's : Needs assistance/impaired Eating/Feeding: Set up;Sitting Eating/Feeding Details (indicate cue type and reason): Pt observed to be self feeding with use of regular utensils. Educated on techniques for self feeding when experiencing resting tremors-support in chair, resting forearms on table top, coming close to food to minimize spilling.  Grooming: Min guard;Standing;Wash/dry hands;Wash/dry Scientist, research (physical sciences)face                   Toilet Transfer: Min guard;Ambulation;RW Toilet Transfer Details (indicate cue type and reason): Simulated by sit to stand from chair with functional mobility in room         Functional mobility during ADLs: Min guard;Rolling walker       Vision       Perception     Praxis      Cognition Arousal/Alertness: Awake/alert Behavior During Therapy: WFL for tasks assessed/performed Overall Cognitive Status: Within Functional Limits for tasks assessed                                          Exercises     Shoulder Instructions       General Comments      Pertinent Vitals/ Pain       Pain Assessment: Faces Faces Pain Scale: Hurts little more Pain Location: L elbow and shoulder Pain Descriptors / Indicators: Discomfort;Sore Pain Intervention(s): Monitored during session  Home Living                                          Prior Functioning/Environment              Frequency  Min 3X/week        Progress Toward Goals  OT Goals(current goals can now be found in the care plan section)  Progress towards OT goals: Progressing toward goals  Acute Rehab OT Goals Patient Stated Goal: none stated OT Goal Formulation: With patient  Plan Discharge plan remains appropriate    Co-evaluation                 AM-PAC PT "6 Clicks" Daily Activity     Outcome Measure   Help from another person eating meals?: A Little Help from another person  taking care of personal grooming?: A Little Help from another person toileting, which includes using toliet, bedpan, or urinal?: A Little Help from another person bathing (including washing, rinsing, drying)?: A Little Help from another person to put on and taking off regular upper body clothing?: A Little Help from another person to put on and taking off regular lower body clothing?: A Little 6 Click Score: 18    End of Session Equipment Utilized During Treatment: Rolling walker;Gait belt  OT Visit Diagnosis: Other abnormalities of gait and mobility (R26.89);Repeated falls (R29.6);Muscle weakness (generalized) (M62.81)   Activity Tolerance Patient tolerated treatment well   Patient Left in chair;with call bell/phone within reach;with chair alarm set   Nurse Communication          Time: 1610-9604 OT Time Calculation (min): 23 min  Charges: OT General Charges $OT Visit: 1 Procedure OT Treatments $Self Care/Home Management : 23-37 mins  Brody Bonneau A. Brett Albino, M.S., OTR/L Pager: [phone]   Gaye Alken 09/08/2016, 2:01 PM

## 2016-09-08 NOTE — Progress Notes (Addendum)
   Subjective:  Pt look improved today. Sitting up in chair. Tremors improved, but still with significant functional limitation. No acute complaints.  Objective:  Vital signs in last 24 hours: Vitals:   09/07/16 1045 09/07/16 1815 09/07/16 2034 09/08/16 0506  BP: (!) 150/66 (!) 158/79 (!) 150/84 (!) 126/57  Pulse: 80 62 78 (!) 59  Resp: 17 17 18 17   Temp: 99.4 F (37.4 C) 97.5 F (36.4 C) 98 F (36.7 C) 97.6 F (36.4 C)  TempSrc: Oral Oral    SpO2: 97% 100% 100% 99%  Weight:      Height:       Physical Exam  Constitutional: No distress.  Cardiovascular: Normal rate, regular rhythm and normal heart sounds.   Pulmonary/Chest: Effort normal and breath sounds normal.  Abdominal: Soft. Bowel sounds are normal. There is no tenderness.  Musculoskeletal: He exhibits no edema.  Neurological:  Improved tremors BUE and no rigidity.   Assessment/Plan:  Principal Problem:   Generalized weakness Active Problems:   Parkinson's disease (HCC)   Diabetes type 2, controlled [facility](HCC)  [name] is a 71 y.o. male with h/o DM and PD who present for generalized weakness and inability to care for himself at home.  1) Parkinson's Disease: Pt appears improved today on his Parkinson's medications. However, his disease presents with chronic functional limitations that can be optimized at best, but not completely resolved. As a result of these disabilities, the patient remains unsafe for discharge home without support for IADLs and ADLs. Family support has been unable to be contact in spite of significant efforts. CSW has contacted DSS to attempt to obtain further resources, but we have not yet found any leads. The patient appears stable currently, and we will continue to monitor the patient. He will continue to benefit from mobilization with PT/OT, but these therapies are unlikely to change his optimal functional status. - carbidopa/levodopa 25/250, 2 tablets at 6 AM/10 AM/2 PM/6 PM -  carbidopa/levodopa 50/200 at bedtime - pramipexole 0.5 mg 3 times a day - PT/OT  Dispo: Anticipated discharge when safe disposition is available.  Carolynn CommentStrelow, Kellie Chisolm, MD 09/08/2016, 8:52 AM Pager: [phone]

## 2016-09-08 NOTE — Progress Notes (Signed)
Physical Therapy Treatment Patient Details Name: Brandon Carrillo MRN: [id number] DOB: 05/18/1945 Today's Date: 09/08/2016    History of Present Illness Brandon Carrillo is a 71 y.o. male with a h/o of Parkinsons Disease (followed in [facility]) who presents with c/o generalized weakness, medication non-adherence, and inability to care for himself.    PT Comments    Pt demonstrates improved gait and transfers this session as compared to previous session but safety continues to be an issue. Due to symptoms and side effects of PD, pt has increased posterior lean in standing and difficulty standing from lower surfaces. Pt is, however, improved from previous sessions. Pt will not have a lot of family support or the ability to perform therapy once he returns home which is important to his recovery. Pt continues to benefit from CIR for continued therapy prior to DC home.     Follow Up Recommendations  CIR     Equipment Recommendations  None recommended by PT    Recommendations for Other Services       Precautions / Restrictions Precautions Precautions: Fall Precaution Comments: history of multiple falls at home Restrictions Weight Bearing Restrictions: No    Mobility  Bed Mobility               General bed mobility comments: Pt OOB in chair upon arrival  Transfers Overall transfer level: Needs assistance Equipment used: Rolling walker (2 wheeled) Transfers: Sit to/from Stand Sit to Stand: Min guard         General transfer comment: X2-3 attempts needed to stand. Pt able to boost up without physical assistance. Good hand placement. Pt does, however, fall back into chair after 1 attempt.   Ambulation/Gait Ambulation/Gait assistance: Min assist Ambulation Distance (Feet): 50 Feet Assistive device: Rolling walker (2 wheeled) Gait Pattern/deviations: Decreased step length - right;Decreased step length - left;Trunk flexed Gait velocity:  decreased Gait velocity interpretation: Below normal speed for age/gender General Gait Details: cues for increased step length, staying upright. Pt able to keep walking in front of him    Stairs            Wheelchair Mobility    Modified Rankin (Stroke Patients Only)       Balance Overall balance assessment: Needs assistance Sitting-balance support: Feet supported;No upper extremity supported Sitting balance-Leahy Scale: Fair Sitting balance - Comments: forward trunk lean in sitting and difficulty getting upright   Standing balance support: No upper extremity supported;During functional activity Standing balance-Leahy Scale: Fair Standing balance comment: able to stand briefly in front of RW with minimal posterior postrual sway                            Cognition Arousal/Alertness: Awake/alert Behavior During Therapy: WFL for tasks assessed/performed Overall Cognitive Status: Within Functional Limits for tasks assessed                                        Exercises Other Exercises Other Exercises: Performed sit to stand x 5 with cues to scoot to edge of chair and lean foward to stand Other Exercises: attempted high intensity high amplitude exercise in sitting. Floor to cieling reaches with sustained holds with UE's abd x 3.    General Comments        Pertinent Vitals/Pain Pain Assessment: Faces Faces Pain Scale: Hurts little more Pain  Location: L elbow and shoulder Pain Descriptors / Indicators: Discomfort;Sore Pain Intervention(s): Monitored during session    Home Living                      Prior Function            PT Goals (current goals can now be found in the care plan section) Acute Rehab PT Goals Patient Stated Goal: none stated Progress towards PT goals: Progressing toward goals    Frequency    Min 3X/week      PT Plan Current plan remains appropriate    Co-evaluation              AM-PAC  PT "6 Clicks" Daily Activity  Outcome Measure  Difficulty turning over in bed (including adjusting bedclothes, sheets and blankets)?: A Little Difficulty moving from lying on back to sitting on the side of the bed? : A Lot Difficulty sitting down on and standing up from a chair with arms (e.g., wheelchair, bedside commode, etc,.)?: Total Help needed moving to and from a bed to chair (including a wheelchair)?: A Lot Help needed walking in hospital room?: A Lot Help needed climbing 3-5 steps with a railing? : Total 6 Click Score: 11    End of Session Equipment Utilized During Treatment: Gait belt Activity Tolerance: Patient tolerated treatment well Patient left: in chair;with call bell/phone within reach;with chair alarm set;with nursing/sitter in room Nurse Communication: Mobility status PT Visit Diagnosis: Unsteadiness on feet (R26.81);Other abnormalities of gait and mobility (R26.89);Muscle weakness (generalized) (M62.81);History of falling (Z91.81)     Time: [id number] PT Time Calculation (min) (ACUTE ONLY): 25 min  Charges:  $Gait Training: 8-22 mins $Therapeutic Exercise: 8-22 mins                    G Codes:       Brandon Carrillo PT, DPT  [phone]    Brandon Carrillo 09/08/2016, 3:29 PM

## 2016-09-08 NOTE — Telephone Encounter (Signed)
Received phone call from Kandace ParkinsKelsey White, coordinator with [facility] and she was able to obtain some additional phone numbers that may be helpful. Pt church is [employer] [address], [place] and phone number is [phone]. Also, Adelina MingsKelsey was given another son's name and phone number that lives in [place], [place] [phone], but it is unknown how much knowledge or involvement Elita QuickJose has regarding pt. Kandace ParkinsKelsey White discussed that [employer] is aware of pt situation and will notify Adelina MingsKelsey or this CSW of any assistance that may could help given pt situation.   CSW contacted [facility] LCSW, Genelle BalVanessa Crawford and provided update and contact phone numbers as she continues to make attempts to reach pt family and develop safe disposition plan.   This Clinic CSW will continue to follow for support and update hospital LCSW if any further information received that may be of assistance.   Loletta SpecterSuzanna Kidd, MSW, LCSW Clinical Social Worker [facility] [phone]

## 2016-09-09 DIAGNOSIS — K5900 Constipation, unspecified: Secondary | ICD-10-CM

## 2016-09-09 LAB — GLUCOSE, CAPILLARY
GLUCOSE-CAPILLARY: 131 mg/dL — AB (ref 65–99)
Glucose-Capillary: 121 mg/dL — ABNORMAL HIGH (ref 65–99)
Glucose-Capillary: 104 mg/dL — ABNORMAL HIGH (ref 65–99)
Glucose-Capillary: 120 mg/dL — ABNORMAL HIGH (ref 65–99)

## 2016-09-09 MED ORDER — SENNOSIDES-DOCUSATE SODIUM 8.6-50 MG PO TABS
2.0000 | ORAL_TABLET | Freq: Two times a day (BID) | ORAL | Status: DC
  Administered 2016-09-09 – 2016-09-10 (×3): 2 via ORAL
  Filled 2016-09-09 (×3): qty 2

## 2016-09-09 MED ORDER — MAGNESIUM CITRATE PO SOLN
1.0000 | Freq: Once | ORAL | Status: AC
  Administered 2016-09-09: 1 via ORAL
  Filled 2016-09-09: qty 296

## 2016-09-09 NOTE — Progress Notes (Signed)
   Subjective:  Pt looks much improved today, w/o tremor. Endorses strong desire to have assistance with his medications and to go home to his trailer today. Reports that he is able to have some assistance through a member of his Church named [name].  Pt had episode of vomiting yesterday evening and continues to endorses abd distention and pain. No BM in several days. Passing flatus.  Objective:  Vital signs in last 24 hours: Vitals:   09/08/16 1650 09/08/16 2132 09/09/16 0431 09/09/16 0925  BP: (!) 145/73 (!) 157/90 (!) 126/97 126/76  Pulse: (!) 58 74 77 66  Resp: 17 18 16 18   Temp: 97.5 F (36.4 C) 97.6 F (36.4 C) 98.4 F (36.9 C) 98.3 F (36.8 C)  TempSrc: Oral Oral Oral Oral  SpO2: 100% 100% 99% 98%  Weight:      Height:       Physical Exam  Constitutional: No distress.  Cardiovascular: Normal rate, regular rhythm and normal heart sounds.   Pulmonary/Chest: Effort normal and breath sounds normal.  Abdominal: Soft. Bowel sounds are normal. There is no tenderness.  Musculoskeletal: He exhibits no edema.  Neurological:  Improved tremors BUE and no rigidity.   Assessment/Plan:  Principal Problem:   Generalized weakness Active Problems:   Parkinson's disease (HCC)   Diabetes type 2, controlled [facility](HCC)  [name] is a 71 y.o. male with h/o DM and PD who present for generalized weakness and inability to care for himself at home.  1) Parkinson's Disease: Pt continues to improve today on his Parkinson's medications. We still have significant concerns regarding his safety and functional ability at home w/o assistance. The pt endorses that he does have some support from a church-member whom we will attempt to contact. He expresses a strong desire to return home, but I suspect this is due to anxiety related to being in the hospital where he does not speak the language in an unfamiliar medical system. - carbidopa/levodopa 25/250, 2 tablets at 6 AM/10 AM/2 PM/6 PM -  carbidopa/levodopa 50/200 at bedtime - pramipexole 0.5 mg 3 times a day - PT/OT  2) Constipation / Abd pain: We will advance pt's bowel regimen today with mag-citrate. Will avoid enema d/t pt preference. - 0.5 bottle Mag-Citrate  Dispo: Anticipated discharge when safe disposition is available.  Carolynn CommentStrelow, Jatara Huettner, MD 09/09/2016, 10:45 AM Pager: [phone]

## 2016-09-09 NOTE — Progress Notes (Signed)
Medicine attending: I examined this patient today and I concur with the evaluation and management plan as recorded by resident physician Dr.Bryan Strelow. Good control of his resting tremor at this time with recent medication adjustments. He had an episode of nausea and vomiting yesterday.  Complains of constipation.  Abdominal exam benign.  We will start a laxative. We were able to get in touch with 1 of his sons and [place] who is willing to provide whatever support he can for the patient. It appears through second hand information from the son that local family members have not abandoned him although we have not been able to contact any of them during this admission.  We were also able to make contact with a church member who is willing to provide some local support. Patient expresses a strong desire to go back to his trailer.  I think this will be feasible now that his tremors are under control and we have lined up some support.  We will try to provide him with a few days of medication at discharge until we can confirm that his prescriptions have been filled and are available to him. We greatly appreciate efforts by our care management team.

## 2016-09-10 LAB — GLUCOSE, CAPILLARY
GLUCOSE-CAPILLARY: 134 mg/dL — AB (ref 65–99)
GLUCOSE-CAPILLARY: 121 mg/dL — AB (ref 65–99)
Glucose-Capillary: 139 mg/dL — ABNORMAL HIGH (ref 65–99)
Glucose-Capillary: 126 mg/dL — ABNORMAL HIGH (ref 65–99)

## 2016-09-10 MED ORDER — BISACODYL 10 MG RE SUPP
10.0000 mg | Freq: Every day | RECTAL | Status: DC
  Administered 2016-09-10: 10 mg via RECTAL
  Filled 2016-09-10: qty 1

## 2016-09-10 MED ORDER — SENNOSIDES-DOCUSATE SODIUM 8.6-50 MG PO TABS: 2.0000 | ORAL_TABLET | Freq: Two times a day (BID) | ORAL | Status: DC
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
## 2016-09-10 NOTE — Progress Notes (Signed)
I met with patient's son Philippa Chester, his wife, and their 2 children this afternoon. Patient's son states his father lives in an RV just outside his house. States the patient gets his meals from [employer] Monday through Friday on a regular basis and the remainder of meals are provided by his daughter-in-law ([name]'s wife). States patient's RV has a new air conditioner in it. Son does not seem concerned about a water leak at the place and states it is just a minor problem with a hose which he is planning to fix soon. Son states patient has 7 children total: 4 sons and 3 daughters. 3 of his sons including Mr. Philippa Chester live in [place] and all the daughters live in the [place] area as well. Only one son lives far away in [place]. Per Philippa Chester, he is the primary caretaker for his father but his sister's help out as well by taking the patient to doctor's appointments and picking up his medications from the pharmacy. As per patient's concern about getting medications from his primary care physician, son states the family has been picking up his refills on time. Son is concerned that the patient might be taking more medication than what is prescribed to him, and as such, runs out of medication much sooner than the expected refill date. He states the patient is sometimes confused and wanders off in the neighborhood. He does mention that he is planning to leave his house and move away in the near future. States he would take his father with him unless Mr. Samara Snide decided to stay back.  When asked whether he would want his father to go to a nursing facility, son stated that is entirely up to the patient. Son states social services has visited his property several times in the past. He believes he is doing everything he can to provide for his father for the past 7 years while taking care of his own family. When asked whether other siblings would be willing to be more proactive in taking care of the patient, the son could  not give me a clear answer. Stated "you know how families are."   I later spoke to social work on the phone. Anticipate discharge tomorrow. Social work will come up with a safe discharge plan for the patient.
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
## 2016-09-10 NOTE — Progress Notes (Signed)
   Subjective:  Pt looks much improved today, w/o tremor. Reports that he is able to have some assistance through a member of his Church named [name]. Continues to complain of diffuse abdominal pain and nausea. Reports having 1 small bowel movement yesterday. Continues to pass flatus. Per nursing staff, patient has been finishing all his meals without any episodes of vomiting. Patient also complaining of blurry vision which is chronic. States he does not wear eyeglasses.    Objective:  Vital signs in last 24 hours: Vitals:   09/09/16 0925 09/09/16 2100 09/10/16 0438 09/10/16 0951  BP: 126/76 128/85 (!) 150/71 (!) 105/56  Pulse: 66 78 64 61  Resp: 18 18 18 18   Temp: 98.3 F (36.8 C) 98.4 F (36.9 C) 97.7 F (36.5 C) 98.4 F (36.9 C)  TempSrc: Oral Oral Oral Oral  SpO2: 98% 99% 100% 100%  Weight:      Height:       Physical Exam  Constitutional: No distress.  Cardiovascular: Normal rate, regular rhythm and normal heart sounds.   Pulmonary/Chest: Effort normal and breath sounds normal.  Abdominal: Soft.  Distention improved since prior exam. Mild diffuse tenderness to palpation. Normoactive bowel sounds.   Musculoskeletal: He exhibits no edema.  Neurological:  Improved tremors BUE and no rigidity.   Assessment/Plan:  Principal Problem:   Generalized weakness Active Problems:   Parkinson's disease (HCC)   Diabetes type 2, controlled [facility](HCC)  [name] is a 71 y.o. male with h/o DM and PD who present for generalized weakness and inability to care for himself at home.  1) Parkinson's Disease: Pt continues to improve today on his Parkinson's medications. We still have significant concerns regarding his safety and functional ability at home w/o assistance. The pt endorses that he does have some support from a church-member whom we will attempt to contact. - carbidopa/levodopa 25/250, 2 tablets at 6 AM/10 AM/2 PM/6 PM - carbidopa/levodopa 50/200 at bedtime -  pramipexole 0.5 mg 3 times a day - PT/OT - Patient will need a vision exam as outpatient   2) Constipation / Abd pain: Pt has a small bowel movement yesterday. He continues to pass flatus and is able to tolerate PO intake. Will avoid enema d/t pt preference. -Miralax daily -Senokot-S 2 tabs BID -Dulcolax suppository 10 mg daily  -Zofran prn nausea   Dispo: Anticipated discharge when safe disposition is available.  John Giovanniathore, Timica Marcom, MD 09/10/2016, 12:10 PM Pager: [phone]

## 2016-09-11 LAB — GLUCOSE, CAPILLARY
GLUCOSE-CAPILLARY: 166 mg/dL — AB (ref 65–99)
GLUCOSE-CAPILLARY: 89 mg/dL (ref 65–99)

## 2016-09-11 LAB — CREATININE, SERUM
Creatinine, Ser: 0.59 mg/dL — ABNORMAL LOW (ref 0.61–1.24)
GFR calc Af Amer: >60 mL/min (ref >60)
GFR calc non Af Amer: >60 mL/min (ref >60)

## 2016-09-11 MED ORDER — POLYETHYLENE GLYCOL 3350 17 G PO PACK: 17.0000 g | PACK | Freq: Every day | ORAL | 0 refills | Status: DC

## 2016-09-11 MED ORDER — CARBIDOPA-LEVODOPA 25-250 MG PO TABS: 2.0000 | ORAL_TABLET | Freq: Four times a day (QID) | ORAL | 2 refills | Status: DC

## 2016-09-11 MED ORDER — PRAMIPEXOLE DIHYDROCHLORIDE 0.25 MG PO TABS: 0.2500 mg | ORAL_TABLET | Freq: Three times a day (TID) | ORAL | 1 refills | Status: DC

## 2016-09-11 NOTE — Progress Notes (Signed)
Error in charting, IV of Right forearm was removed, patient did not leave hospital with line intact and catheter was intact after removal.   Patient discharged to home, IV was discontinued,  Patient education completed with patient and daughter, AVS reviewed, prescriptions and Match letter provided, patient and daughter instructed to follow up with [facility] health [facility] wellness tomorrow

## 2016-09-11 NOTE — Care Management Note (Signed)
Case Management Note  Patient Details  Name: Brandon Carrillo MRN: [id number] Date of Birth: 01/23/1946  Subjective/Objective:     CM following for progression and d/c planning.                Action/Plan: 09/11/2016 Plan to d/c to home today, [facility] for tomorrow 09/12/2016. Will not attempt to reschedule as this pt needs followup and [facility] soon as possible. [facility] to assist with medications.   Expected Discharge Date:                  Expected Discharge Plan:  Home/Self Care  In-House Referral:  Clinical Social Work  Discharge planning Services  CM Consult  Post Acute Care Choice:  NA Choice offered to:  NA  DME Arranged:  N/A DME Agency:  NA  HH Arranged:  NA HH Agency:  NA  Status of Service:  Completed, signed off  If discussed at Long Length of Stay Meetings, dates discussed:    Additional Comments:  Brandon Carrillo, Brandon Carrillo U, RN 09/11/2016, 9:41 AM
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
## 2016-09-11 NOTE — Progress Notes (Signed)
Physical Therapy Treatment Patient Details Name: Brandon Carrillo MRN: [id number] DOB: 05/15/1945 Today's Date: 09/11/2016    History of Present Illness [name] is a 71 y.o. male with a h/o of Parkinsons Disease (followed in [facility]) who presents with c/o generalized weakness, medication non-adherence, and inability to care for himself.    PT Comments    Continuing work on functional mobility and activity tolerance;  Session focused more on gait, and safety education as noted pt will dc today; made the most important point to be consistent with his meds; told [facility] (Case Mgr has set it up); Also discussed the importance of using RW at this time for balance, and to work on posture.  I'm aware that the team has opted not to follow up on Inpatient rehab.  Follow Up Recommendations  CIR     Equipment Recommendations  None recommended by PT    Recommendations for Other Services       Precautions / Restrictions Precautions Precautions: Fall Precaution Comments: history of multiple falls at home Restrictions Weight Bearing Restrictions: No    Mobility  Bed Mobility                  Transfers Overall transfer level: Needs assistance Equipment used: Rolling walker (2 wheeled) Transfers: Sit to/from Stand Sit to Stand: Min assist         General transfer comment: Very slow rise with some posterior bias; min assist to help with anterior weight shift  Ambulation/Gait Ambulation/Gait assistance: Min assist   Assistive device: Rolling walker (2 wheeled) Gait Pattern/deviations: Decreased step length - right;Decreased step length - left;Trunk flexed Gait velocity: decreased   General Gait Details: cues for increased step length, staying upright. able to straighten up well with the cue of standing like a soldier ("como un soldado"), gets upright, but trunk extensors  fatigue quickly and pt sinks back into flexed trunk posture; employed music to help with rhythm and meter of walking   Stairs            Wheelchair Mobility    Modified Rankin (Stroke Patients Only)       Balance     Sitting balance-Leahy Scale: Fair       Standing balance-Leahy Scale: Poor                              Cognition Arousal/Alertness: Awake/alert Behavior During Therapy: WFL for tasks assessed/performed Overall Cognitive Status: No family/caregiver present to determine baseline cognitive functioning Area of Impairment: Memory                     Memory: Decreased short-term memory                Exercises      General Comments General comments (skin integrity, edema, etc.): Graciela present for session and provided interpreting services      Pertinent Vitals/Pain Pain Assessment: Faces Faces Pain Scale: Hurts a little bit Pain Location: R flank pain Pain Descriptors / Indicators: Discomfort Pain Intervention(s): Monitored during session    Home Living                      Prior Function            PT Goals (current goals can now be found in the care plan section) Acute  Rehab PT Goals Patient Stated Goal: none stated PT Goal Formulation: With patient Time For Goal Achievement: 09/19/16 Potential to Achieve Goals: Fair Progress towards PT goals: Progressing toward goals    Frequency    Min 3X/week      PT Plan Current plan remains appropriate    Co-evaluation              AM-PAC PT "6 Clicks" Daily Activity  Outcome Measure  Difficulty turning over in bed (including adjusting bedclothes, sheets and blankets)?: A Little Difficulty moving from lying on back to sitting on the side of the bed? : A Lot Difficulty sitting down on and standing up from a chair with arms (e.g., wheelchair, bedside commode, etc,.)?: A Lot Help needed moving to and from a bed to chair (including a wheelchair)?:  A Little Help needed walking in hospital room?: A Little Help needed climbing 3-5 steps with a railing? : A Lot 6 Click Score: 15    End of Session Equipment Utilized During Treatment: Gait belt Activity Tolerance: Patient tolerated treatment well Patient left: in chair;with call bell/phone within reach;with chair alarm set;with nursing/sitter in room Nurse Communication: Mobility status PT Visit Diagnosis: Unsteadiness on feet (R26.81);Other abnormalities of gait and mobility (R26.89);Muscle weakness (generalized) (M62.81);History of falling (Z91.81)     Time: 1610-9604 PT Time Calculation (min) (ACUTE ONLY): 26 min  Charges:  $Gait Training: 23-37 mins                    G Codes:       Van Clines, [place]  Acute Rehabilitation Services Pager [phone] Office [phone]    Levi Aland 09/11/2016, 11:51 AM

## 2016-09-12 ENCOUNTER — Encounter: Payer: Self-pay | Admitting: Internal Medicine

## 2016-09-12 ENCOUNTER — Ambulatory Visit: Payer: Self-pay | Attending: Internal Medicine | Admitting: Internal Medicine

## 2016-09-12 VITALS — BP 119/63 | HR 64 | Temp 98.0°F | Resp 18 | Ht 67.0 in | Wt 171.6 lb

## 2016-09-12 DIAGNOSIS — I252 Old myocardial infarction: Secondary | ICD-10-CM | POA: Insufficient documentation

## 2016-09-12 DIAGNOSIS — R531 Weakness: Secondary | ICD-10-CM | POA: Insufficient documentation

## 2016-09-12 DIAGNOSIS — G20 Parkinson's disease: Secondary | ICD-10-CM | POA: Insufficient documentation

## 2016-09-12 DIAGNOSIS — F329 Major depressive disorder, single episode, unspecified: Secondary | ICD-10-CM | POA: Insufficient documentation

## 2016-09-12 DIAGNOSIS — E119 Type 2 diabetes mellitus without complications: Secondary | ICD-10-CM | POA: Insufficient documentation

## 2016-09-12 DIAGNOSIS — F32A Depression, unspecified: Secondary | ICD-10-CM

## 2016-09-12 DIAGNOSIS — E559 Vitamin D deficiency, unspecified: Secondary | ICD-10-CM | POA: Insufficient documentation

## 2016-09-12 DIAGNOSIS — E1121 Type 2 diabetes mellitus with diabetic nephropathy: Secondary | ICD-10-CM

## 2016-09-12 DIAGNOSIS — Z79899 Other long term (current) drug therapy: Secondary | ICD-10-CM | POA: Insufficient documentation

## 2016-09-12 DIAGNOSIS — Z9181 History of falling: Secondary | ICD-10-CM | POA: Insufficient documentation

## 2016-09-12 LAB — GLUCOSE, POCT (MANUAL RESULT ENTRY): POC GLUCOSE: 218 mg/dL — AB (ref 70–99)

## 2016-09-12 MED ORDER — CARBIDOPA-LEVODOPA ER 50-200 MG PO TBCR: 1.0000 | EXTENDED_RELEASE_TABLET | Freq: Every day | ORAL | 6 refills | Status: DC

## 2016-09-12 MED FILL — CARBIDOPA-LEVO ER 50-200 TA: 50-200 | 30 days supply | Qty: 30 | Fill #0

## 2016-09-12 MED FILL — POLYETHYLENE GLYCOL 3350: 16 days supply | Qty: 255 | Fill #0

## 2016-09-12 NOTE — Patient Instructions (Addendum)
Please take your medications as prescribed. Use your walker at home and when in public.

## 2016-09-12 NOTE — Progress Notes (Signed)
Patient ID: Brandon Carrillo, male    DOB: 06/05/1945  MRN: [id number]  CC:  [email or web address]   Subjective:  Brandon Carrillo is a 71 y.o. male who presents for hosp f/u. Daughter, Donata ClayKarina, is with him His concerns today include:  Patient with history of Parkinson's, diabetes type 2, depression. [facility] from 5/14-/21/2018 with generalized weakness and inability to care for himself. Patient had been out of his Parkinson's medication for about 6 days.  Patient apparently lives alone and there was some difficulty in getting in contact with relatives. He was dischg to the care of his daughter. Case has been referred to adult protective services.  1. Parkinson's: feeling better.  -states he is back at his house.  Karina checks in on him.  Daughter-in-law also helps with meals -on Sinemet and Mirepex. Has rxn to fill today at the pharmacy -no falls.  Has walker at home which he left today as he was in a rush to get here.  Felt a litte nauseous this a.m  2. DM: was prescribed Metformin on last visit with Dr. Armen PickupFunches in 08/2015 but he has been out for a while. -BS in hosp ok. -recent A1C 6.2 from hosp  3. History of depression: Depression screen positive today but patient denies feeling down. Patient Active Problem List   Diagnosis Date Noted  . Generalized weakness 09/04/2016  . Onychomycosis of toenail 09/17/2015  . Constipation 09/17/2015  . Bilateral knee pain 09/17/2015  . Other fatigue 09/17/2015  . Vitamin D deficiency 09/17/2015  . Pain, dental 09/16/2015  . Diabetes type 2, controlled (HCC) 03/02/2015  . Fall at home 03/02/2015  . At high risk for falls 03/02/2015  . NSTEMI (non-ST elevated myocardial infarction) (HCC) 08/30/2014  . Parkinson's disease (HCC) 10/21/2013  . Depression 10/21/2013  . Midline thoracic back pain 10/21/2013     Current Outpatient Prescriptions on File Prior to Visit  Medication Sig Dispense Refill  .  carbidopa-levodopa (SINEMET IR) 25-250 MG tablet Take 2 tablets by mouth 4 (four) times daily. 120 tablet 2  . Multiple Vitamin (MULTIVITAMIN) tablet Take 1 tablet by mouth daily.    . polyethylene glycol (MIRALAX / GLYCOLAX) packet Take 17 g by mouth daily. 14 each 0  . pramipexole (MIRAPEX) 0.25 MG tablet Take 1 tablet (0.25 mg total) by mouth 3 (three) times daily. 270 tablet 1   No current facility-administered medications on file prior to visit.     No Known Allergies   ROS: Review of Systems  Constitutional: Positive for appetite change (decreased) and fatigue. Negative for fever.  Respiratory: Negative for cough.   Cardiovascular: Negative for chest pain.  Gastrointestinal: Positive for constipation.     PHYSICAL EXAM: BP 119/63 (BP Location: Left Arm, Patient Position: Sitting, Cuff Size: Normal)   Pulse 64   Temp 98 F (36.7 C)   Resp 18   Ht 5\' 7"  (1.702 m)   Wt 171 lb 9.6 oz (77.8 kg)   SpO2 98%   BMI 26.88 kg/m   Wt Readings from Last 3 Encounters:  09/12/16 171 lb 9.6 oz (77.8 kg)  09/06/16 176 lb 5.9 oz (80 kg)  08/18/16 168 lb (76.2 kg)    Physical Exam  Constitutional:  Pleasant elderly Hispanic male in no acute distress. Has a mild resting tremor in his left arm  HENT:  Head: Normocephalic.  Eyes: EOM are normal. Pupils are equal, round, and reactive to light.  Neck: No thyromegaly present.  Cardiovascular:  Normal rate and regular rhythm.   No murmur heard. Trace lower extremity edema bilaterally  Pulmonary/Chest: Effort normal and breath sounds normal.  Abdominal: Soft. Bowel sounds are normal.  Lymphadenopathy:    He has no cervical adenopathy.  Neurological: He is alert.  Stiff gait.  Does not have walk with him today    Results for orders placed or performed in visit on 09/12/16  Glucose (CBG)  Result Value Ref Range   POC Glucose 218 (A) 70 - 99 mg/dl    ASSESSMENT AND PLAN: 1. Controlled type 2 diabetes mellitus without  complication, without long-term current use of insulin (HCC) -Continue to observe off medication - Glucose (CBG) - Microalbumin / creatinine urine ratio - Ambulatory referral to Ophthalmology  2. Depression, unspecified depression type -Patient declines medication for this  3. Parkinson's disease (HCC) -Daughter plans to take prescription for Sinemet and Mirapex to the pharmacy today. I have added the prescription for the nighttime dose of Sinemet that his neurologist wants him to be on. -has CSW who is following along to address psychosocial concerns. - carbidopa-levodopa (SINEMET CR) 50-200 MG tablet; Take 1 tablet by mouth at bedtime.  Dispense: 30 tablet; Refill: 6  Patient was given the opportunity to ask questions.  Patient verbalized understanding of the plan and was able to repeat key elements of the plan.  [employer] interpreter used during this encounter.   Orders Placed This Encounter  Procedures  . Microalbumin / creatinine urine ratio  . Ambulatory referral to Ophthalmology  . Glucose (CBG)     Requested Prescriptions   Signed Prescriptions Disp Refills  . carbidopa-levodopa (SINEMET CR) 50-200 MG tablet 30 tablet 6    Sig: Take 1 tablet by mouth at bedtime.    Future Appointments Date Time Provider Department Center  12/18/2016 11:15 AM Tat, Octaviano Batty, DO [facility] None    Jonah Blue, MD, Jerrel Ivory

## 2016-09-13 LAB — MICROALBUMIN / CREATININE URINE RATIO
Creatinine, Urine: 94.8 mg/dL
Microalb/Creat Ratio: 3.3 mg/g creat (ref 0.0–30.0)
Microalbumin, Urine: 3.1 ug/mL

## 2016-09-20 ENCOUNTER — Telehealth: Payer: Self-pay | Admitting: Clinical

## 2016-09-20 ENCOUNTER — Ambulatory Visit: Payer: Self-pay | Attending: Internal Medicine

## 2016-09-20 NOTE — Telephone Encounter (Signed)
CSW had follow up phone call with Brandon MingsKelsey with [facility] [facility] to update regarding pt discharge from the hospital and resources available to patient. CSW discussed that pt son had expressed to hospital CSW that he was frustrated with lack of follow up after visits to the home from [employer] and wanted to help pt, but also needed resources to do so.   Per Brandon MingsKelsey, [facility] is willing to meet with pt and pt family to provide some case management and connection to resources, but wants to ensure that pt family is agreeable to this and recognizes that [name] is a private agency that can assist with family involvement.  CSW contacted pt son, Brandon EisenmengerFelix [phone](C) [phone], using telephonic spanish interpreter. Introduced self to [name] and explained role as pt outpatient Psychologist, sport and exerciseneurologist social worker. CSW discussed that CSW following up to discuss resources following hospitalization. CSW discussed resource of [name] and explained that this is a Holiday representativeprivate agency not associated with Department of Social Services. CSW discussed with pt son that [name] works closely with the [place] [employer] community and can meet with pt and pt family to discuss resources that pt may be able to utilize. CSW explained that [facility] can make follow up visits following the initial visit to continue to assess needs and connect to appropriate resources. CSW discussed importance of pt family being involved with contact with [facility]. Pt son agrees to being contacted by [name] and appreciative of resource. Pt son states that he works during the day and usually available for a phone call at 6 pm and has voicemail set up if message needs to be left. Pt reports that he is unable to have his phone with him during his shift, but CSW was able to reach today as pt son was on a short break from work.  CSW discussed that CSW will  relay information to [facility] and for pt son to be expecting phone call from [facility] soon. Pt son expressed understanding.   CSW contacted Brandon MingsKelsey, [facility] coordinator and updated on conversation with pt son and discussed that pt son is agreeable to involvement of [name]. Confirmed Brandon MingsKelsey had pt son best contact information and relayed message about best time of day to reach pt son by phone. [facility] coordinator plans to follow up with pt son to schedule visit.   CSW to continue to follow for psychosocial support and connection to appropriate resources.   Loletta SpecterSuzanna Carrillo, MSW, LCSW Clinical Social Worker [facility] [phone]

## 2016-09-29 MED FILL — CARBIDOPA-LEVODOPA 25-250 T: 25-250 | 15 days supply | Qty: 120 | Fill #0

## 2016-09-29 MED FILL — PRAMIPEXOLE 0.5 MG TABLET: 0.5 | 30 days supply | Qty: 90 | Fill #1

## 2016-10-10 MED FILL — CARBIDOPA-LEVO ER 50-200 TA: 50-200 | 30 days supply | Qty: 30 | Fill #1

## 2016-10-10 MED FILL — CARBIDOPA-LEVODOPA 25-250 T: 25-250 | 15 days supply | Qty: 120 | Fill #1

## 2016-10-20 MED FILL — CARBIDOPA-LEVODOPA 25-250 T: 25-250 | 15 days supply | Qty: 120 | Fill #2

## 2016-10-30 ENCOUNTER — Telehealth: Payer: Self-pay | Admitting: Neurology

## 2016-10-30 MED FILL — CARBIDOPA-LEVODOPA 25-250 T: 25-250 | 15 days supply | Qty: 120 | Fill #0

## 2016-10-30 NOTE — Telephone Encounter (Signed)
No available appts until August. Do you need to see patient prior to this?

## 2016-10-30 NOTE — Telephone Encounter (Signed)
LMOM letting Adelina MingsKelsey know no need to bring patient in sooner.

## 2016-10-30 NOTE — Telephone Encounter (Signed)
Thanks for update.  Sounds like these are mostly social issues.  Would like him to take meds as prescribed; if they can tell him that, it would be helpful

## 2016-10-30 NOTE — Telephone Encounter (Signed)
Received call from Kandace ParkinsKelsey White with [facility]. She was currently at pt home with [facility]. Adelina MingsKelsey had some questions about prescribed medications. Left a message for CMA, but recognized she was likely in clinic and seeking information before leaving pt home.   CSW read most recent medications prescribed to pt and dosages as Adelina MingsKelsey had concerns about pt taking medication properly. Per Adelina MingsKelsey, it appears that pt has been taking the carbidopa-levodopa 50-200 MG CR 4 times a day instead of the carbidopa-levodopa 25-250 MG 4 times a day as prescribed. Adelina MingsKelsey states that it does not appear that pt has the carbidopa-levodopa 25-250 MG prescription filled and available at this time to take 4 times daily and will assist pt in contacting pharmacy to fill to help ensure pt has correct medications. Notified that per last office note, 90 day supplies of medication as sent to pharmacy.  Per Adelina MingsKelsey, pt does have electricity and air conditioning in trailer. They are still assessing the plumbing situation. Today was second visit to the home, the first visit involved meeting with family. [facility] assisting in educating family and connecting to additional resources.  Adelina MingsKelsey did ask if there were any available appointments before pt next scheduled appointment on 8/27. I told her I was unsure if there was any availability, but would message Lesly RubensteinJade to ask. Please f/u with Adelina MingsKelsey as she can communicate change in visit to family if there is something available.   Contact for [facility] is Kandace ParkinsKelsey White office phone number is [phone] and cell phone number is [phone].  Loletta SpecterSuzanna Kidd, MSW, LCSW Clinical Social Worker [facility] [phone]

## 2016-11-10 MED FILL — PRAMIPEXOLE 0.5 MG TABLET: 0.5 | 30 days supply | Qty: 90 | Fill #2

## 2016-11-10 MED FILL — CARBIDOPA-LEVODOPA 25-250 T: 25-250 | 15 days supply | Qty: 120 | Fill #1

## 2016-11-10 MED FILL — CARBIDOPA-LEVO ER 50-200 TA: 50-200 | 30 days supply | Qty: 30 | Fill #2

## 2016-11-21 MED FILL — CARBIDOPA-LEVODOPA 25-250 T: 25-250 | 15 days supply | Qty: 120 | Fill #2

## 2016-11-23 ENCOUNTER — Ambulatory Visit: Payer: Self-pay | Attending: Internal Medicine | Admitting: Physician Assistant

## 2016-11-23 ENCOUNTER — Ambulatory Visit (HOSPITAL_COMMUNITY)
Admission: RE | Admit: 2016-11-23 | Discharge: 2016-11-23 | Disposition: A | Discharge status: Home / Self Care | Payer: Self-pay | Source: Ambulatory Visit | Attending: Physician Assistant | Admitting: Physician Assistant

## 2016-11-23 VITALS — BP 143/74 | HR 72 | Resp 18 | Ht 63.0 in | Wt 164.0 lb

## 2016-11-23 DIAGNOSIS — E118 Type 2 diabetes mellitus with unspecified complications: Secondary | ICD-10-CM | POA: Insufficient documentation

## 2016-11-23 DIAGNOSIS — F329 Major depressive disorder, single episode, unspecified: Secondary | ICD-10-CM | POA: Insufficient documentation

## 2016-11-23 DIAGNOSIS — X58XXXA Exposure to other specified factors, initial encounter: Secondary | ICD-10-CM | POA: Insufficient documentation

## 2016-11-23 DIAGNOSIS — G20 Parkinson's disease: Secondary | ICD-10-CM | POA: Insufficient documentation

## 2016-11-23 DIAGNOSIS — M25461 Effusion, right knee: Secondary | ICD-10-CM | POA: Insufficient documentation

## 2016-11-23 DIAGNOSIS — S8001XA Contusion of right knee, initial encounter: Secondary | ICD-10-CM | POA: Insufficient documentation

## 2016-11-23 DIAGNOSIS — M25561 Pain in right knee: Secondary | ICD-10-CM | POA: Insufficient documentation

## 2016-11-23 DIAGNOSIS — M1711 Unilateral primary osteoarthritis, right knee: Secondary | ICD-10-CM | POA: Insufficient documentation

## 2016-11-23 DIAGNOSIS — E7800 Pure hypercholesterolemia, unspecified: Secondary | ICD-10-CM | POA: Insufficient documentation

## 2016-11-23 LAB — GLUCOSE, POCT (MANUAL RESULT ENTRY): POC Glucose: 105 mg/dl — AB (ref 70–99)

## 2016-11-23 MED ORDER — MELOXICAM 7.5 MG PO TABS: 7.5000 mg | ORAL_TABLET | Freq: Every day | ORAL | 0 refills | Status: DC

## 2016-11-23 MED FILL — MELOXICAM 7.5 MG TABLET: 7.5 | 30 days supply | Qty: 30 | Fill #0

## 2016-11-23 NOTE — Progress Notes (Signed)
Patient ID: Brandon Carrillo, male   DOB: 08/25/1945, 71 y.o.   MRN: [id number]   Brandon Carrillo, is a 71 y.o. male  [id number]  [id number]  DOB - 08/16/1945  Subjective:  Chief Complaint and HPI: Brandon Carrillo is a 71 y.o. male here today to be checked out after as fall about 6 days ago.  He was walking without his walker and lost his footing on a curb.  He fell and hit his R elbow and R knee.  His knee is continuing to cause him pain.  His last Tdap was 2016.  He is walking today with assistance but not with his walker.    History given by patient and stratus interpreters used "Brandon Carrillo" in addition to case worker "Brandon Carrillo" translating.   ROS:   Constitutional:  No f/c, No night sweats, No unexplained weight loss. EENT:  No vision changes, No blurry vision, No hearing changes. No mouth, throat, or ear problems.  Respiratory: No cough, No SOB Cardiac: No CP, no palpitations GI:  No abd pain, No N/V/D. GU: No Urinary s/sx Musculoskeletal: +R knee pain Neuro: No headache, no dizziness, no motor weakness.  Skin: No rash Endocrine:  No polydipsia. No polyuria.  Psych: Denies SI/HI  No problems updated.  ALLERGIES: No Known Allergies  PAST MEDICAL HISTORY: Past Medical History:  Diagnosis Date  . Depression   . Diabetes mellitus without complication (HCC)   . Hypercholesteremia   . Parkinson's disease (HCC)   . Parkinson's disease (HCC)     MEDICATIONS AT HOME: Prior to Admission medications   Medication Sig Start Date End Date Taking? Authorizing Provider  carbidopa-levodopa (SINEMET CR) 50-200 MG tablet Take 1 tablet by mouth at bedtime. 09/12/16   Marcine MatarJohnson, Deborah B, MD  carbidopa-levodopa (SINEMET IR) 25-250 MG tablet Take 2 tablets by mouth 4 (four) times daily. 09/11/16   Carolynn CommentStrelow, Bryan, MD  meloxicam (MOBIC) 7.5 MG tablet Take 1 tablet (7.5 mg total) by mouth daily. 11/23/16   Anders SimmondsMcClung, Josaphine Shimamoto M, PA-C  Multiple Vitamin (MULTIVITAMIN) tablet Take 1  tablet by mouth daily.    [provider]  polyethylene glycol (MIRALAX / GLYCOLAX) packet Take 17 g by mouth daily. 09/12/16   Carolynn CommentStrelow, Bryan, MD  pramipexole (MIRAPEX) 0.25 MG tablet Take 1 tablet (0.25 mg total) by mouth 3 (three) times daily. 09/11/16   Carolynn CommentStrelow, Bryan, MD     Objective:  EXAM:   Vitals:   11/23/16 1100  BP: (!) 143/74  Pulse: 72  Resp: 18  Weight: 164 lb (74.4 kg)  Height: 5\' 3"  (1.6 m)    General appearance : A&OX3. NAD. Non-toxic-appearing; initially on exam, tremor was quite active, but as his morning medications started to work, the tremor was less noticeable.  He is walking without a limp with assistance.  HEENT: Atraumatic and Normocephalic.  PERRLA. EOM intact.  TM clear B. Mouth-MMM, post pharynx WNL w/o erythema, No PND. Neck: supple, no JVD. No cervical lymphadenopathy. No thyromegaly Chest/Lungs:  Breathing-non-labored, Good air entry bilaterally, breath sounds normal without rales, rhonchi, or wheezing  CVS: S1 S2 regular, no murmurs, gallops, rubs  Extremities: R arm/elbow-there are 3 <1cm abrasions that have scabbed over on the surface of the elbow.  He has full S&ROM including supination and pronation with normal grip.  No effusion of elbow or ecchymosis.  R knee is examined-he has full S&ROM for age.  +TTP over the patella tendon and medial and lateral line.  The joint itself is stable without laxity.  There are no visible bruises, no ballotment or effusion.  Bilateral Lower Ext shows no edema, both legs are warm to touch with = pulse throughout Neurology:  CN II-XII grossly intact, Non focal.   Psych:  TP linear. J/I WNL. Normal speech. Appropriate eye contact and affect.  Skin:  No Rash  Data Review Lab Results  Component Value Date   HGBA1C 6.2 (H) 09/04/2016   HGBA1C 6.5 (H) 09/16/2015   HGBA1C 6.0 03/02/2015     Assessment & Plan   1. Controlled type 2 diabetes mellitus with complication, without long-term current use of insulin  (HCC) - Glucose (CBG)  -2. Contusion of right knee, initial encounter - DG Knee Complete 4 Views Right; Future  meloxicam (MOBIC) 7.5 MG tablet; Take 1 tablet (7.5 mg total) by mouth daily.  Dispense: 30 tablet; Refill: 0 Will have Brandon Carrillo added to HIPPA forms for results [phone] or [phone] xray show suprapatellar effusion-will advise ACE wrap and refer  Patient have been counseled extensively about nutrition and exercise  Return in about 2 months (around 01/23/2017) for dr Laural BenesJohnson, blood sugar check.  The patient was given clear instructions to go to ER or return to medical center if symptoms don't improve, worsen or new problems develop. The patient verbalized understanding. The patient was told to call to get lab results if they haven't heard anything in the next week.     Georgian CoAngela Almetta Liddicoat, PA-C [facility] [place], [place] [phone]   11/23/2016, 12:02 PM

## 2016-11-27 ENCOUNTER — Telehealth: Payer: Self-pay | Admitting: Internal Medicine

## 2016-11-27 NOTE — Telephone Encounter (Signed)
Will forward to nurse 

## 2016-11-27 NOTE — Telephone Encounter (Signed)
Kandace ParkinsKelsey White called requesting pt. X-ray results. Pt. Has signed DPR authorizing her to receive pt. Information. Please f/u with pt.

## 2016-11-28 NOTE — Telephone Encounter (Signed)
Patient verified DOB Brandon Carrillo is aware of small fluid build up being present over patients knee. A [facility] placed to [facility] and kelsey received the contact information for facility. Also advised to ACE wrap the knee to assist in reducing swelling. No further questions at this time.

## 2016-11-28 NOTE — Telephone Encounter (Signed)
-----   Message from Anders SimmondsAngela M McClung, [place] sent at 11/23/2016  2:03 PM EDT ----- Please call and give results to [facility] [phone] or [phone].  The xray shows small amount of fluid just above the knee cap.  I am referring him to and orthopedist to get in as soon as we can get an appt.  Tell them to get an ACE wrap for support and to reduce swelling.  Take the medication I took as prescribed for pain.  Thanks,  Georgian CoAngela McClung, PA-C

## 2016-12-01 ENCOUNTER — Telehealth: Payer: Self-pay | Admitting: Neurology

## 2016-12-01 ENCOUNTER — Telehealth: Payer: Self-pay

## 2016-12-01 NOTE — Telephone Encounter (Signed)
[facility], gave verbal Rx for Carbidopa 25-250mg  #240, sig: 2 QID no refills

## 2016-12-01 NOTE — Telephone Encounter (Signed)
Unless someone can actually administer them or can buy the self dispensing box for him, those are the only suggestions I have.

## 2016-12-01 NOTE — Telephone Encounter (Signed)
Lm for Brandon Carrillo.

## 2016-12-01 NOTE — Telephone Encounter (Signed)
He is often running out too soon.  Please call [facility] because I don't think he should be out of refills.  Have they used a pill box and someone fill that pill box for him?

## 2016-12-01 NOTE — Telephone Encounter (Signed)
Spoke with Brandon Carrillo.  She says that the pt is almost out of his carbidopa 25-250mg .  (will be out tonight)  She suspects that pt is not taking them as directed as he is out sooner now than before.  Pt Rx is for 15 days, however he is running out on day 9.  She is hoping we have some suggestions as to how to keep pt on track with his medications...Marland Kitchen.Marland Kitchen. Pt family is uninvolved.  Please advise.  Ok to send 30 day supply to pharmacy?

## 2016-12-01 NOTE — Telephone Encounter (Signed)
Adelina [place]Kelsey from [employer] called in regards to PT and his medication carbadopa and has questions about administering it at home CB# [phone]

## 2016-12-01 NOTE — Telephone Encounter (Signed)
I had mentioned a bill box, or even a self dispensing pill box to [name] earlier.  She said that that most likely would not work as pt's family is uninvolved.  As for the self dispensing pill box, she thinks that would be out of the pt's limits - low income, no insurance.   She thinks the problem is that pt may be getting his medications mixed up, which he then ends up taking more of his carbidopa.  Will call Pharmacy to check on available refills.

## 2016-12-01 NOTE — Telephone Encounter (Signed)
I don't think that they will fill it yet because of this.  It looks to me like he has plenty of refills (but maybe he doesn't if they were filling it early?).  They probably aren't refilling it because it isn't time but go ahead and check and see if he has refills.  He receives free med but that doesn't mean that they will fill it early.  I'm not sure how to work around that.  I don't control that system.  If he doesn't have refills give one 30 day supply and no refills.  If he has refills and they just won't fill it, I'm not sure what to do.  Make sure he has a follow up here

## 2016-12-01 NOTE — Telephone Encounter (Signed)
Just FYI - pt has follow up appointment with Dr. Arbutus Leasat on 8/27

## 2016-12-01 NOTE — Telephone Encounter (Signed)
Dr Arbutus Leasat,  [facility]. The Pt picked up his las refill on 11/21/16 for #120, taking 2 QID. [facility] [employer] [facility] is [facility] him, is requesting a thirty day supply and she has talked with him and advised him the refill MUST last 30 days. OK to refill for #240?

## 2016-12-01 NOTE — Telephone Encounter (Signed)
Spoke with Adelina MingsKelsey regarding pt labs she is aware and doesn't have any questions or concerns

## 2016-12-04 ENCOUNTER — Telehealth: Payer: Self-pay | Admitting: Neurology

## 2016-12-04 MED FILL — CARBIDOPA-LEVODOPA 25-250 T: 25-250 | 30 days supply | Qty: 240 | Fill #0

## 2016-12-04 NOTE — Telephone Encounter (Signed)
Wanting to know if the prior auth has been sent to the pharmacy on the carbidopa levodopa  25-250 for  30 day supply

## 2016-12-04 NOTE — Telephone Encounter (Signed)
Spoke with Adelina MingsKelsey and made her aware this was called in on Friday.  Confirmed pharmacy has refill. They are filling medication.

## 2016-12-06 ENCOUNTER — Telehealth: Payer: Self-pay

## 2016-12-06 NOTE — Telephone Encounter (Signed)
-----   Message from Brandon LombardNubia K Carrillo, [place] sent at 12/06/2016  1:41 PM EDT ----- Please inform patient or caregiver of the results.

## 2016-12-06 NOTE — Telephone Encounter (Signed)
CMA call regarding x ray results  Patient caregiver did not answer but left a VM Stating the reason of the call & to call back

## 2016-12-13 ENCOUNTER — Ambulatory Visit: Payer: Self-pay | Admitting: Family Medicine
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
## 2016-12-18 ENCOUNTER — Encounter: Payer: Self-pay | Admitting: Neurology

## 2016-12-18 ENCOUNTER — Telehealth: Payer: Self-pay | Admitting: Neurology

## 2016-12-18 ENCOUNTER — Ambulatory Visit (INDEPENDENT_AMBULATORY_CARE_PROVIDER_SITE_OTHER): Payer: Self-pay | Admitting: Neurology

## 2016-12-18 VITALS — BP 130/60 | HR 68 | Wt 172.0 lb

## 2016-12-18 DIAGNOSIS — G20 Parkinson's disease: Secondary | ICD-10-CM

## 2016-12-18 MED ORDER — CARBIDOPA-LEVODOPA 25-250 MG PO TABS: ORAL_TABLET | ORAL | 2 refills | Status: DC

## 2016-12-18 MED FILL — CARBIDOPA-LEVODOPA 25-250 T: 25-250 | 30 days supply | Qty: 240 | Fill #0

## 2016-12-18 NOTE — Patient Instructions (Signed)
Decrease pramipexole to 0.25 mg twice per day for a week and then decrease to once per week and then stop  Take carbidopa/levodopa 25/250, 2 at 6am/10am/2pm/6pm and ONLY ONE extra at 3am  Continue carbidopa/levodopa 50/200 at night

## 2016-12-18 NOTE — Telephone Encounter (Signed)
-----   Message from Octaviano Batty Tat, DO sent at 12/18/2016  2:00 PM EDT ----- He takes 9 tablets a day and you only sent 120 to the pharmacy?  Am I missing something?  Wouldn't he need 270 for 30 days?

## 2016-12-18 NOTE — Telephone Encounter (Signed)
Called in correct dose with only one refill to [facility].

## 2016-12-18 NOTE — Addendum Note (Signed)
Addended bySilvio Pate on: 12/18/2016 01:11 PM   Modules accepted: Orders

## 2016-12-18 NOTE — Addendum Note (Signed)
Addended bySilvio Pate on: 12/18/2016 01:10 PM   Modules accepted: Orders

## 2016-12-29 ENCOUNTER — Ambulatory Visit: Payer: Self-pay | Admitting: Family Medicine

## 2017-01-02 MED FILL — CARBIDOPA-LEVO ER 50-200 TA: 50-200 | 30 days supply | Qty: 30 | Fill #0

## 2017-01-09 ENCOUNTER — Telehealth: Payer: Self-pay | Admitting: Neurology

## 2017-01-09 NOTE — Telephone Encounter (Signed)
Brandon Carrillo with immigration left a voicemail question regarding a medication question for pt CB# [phone]

## 2017-01-09 NOTE — Telephone Encounter (Signed)
Left message on machine for Brandon Carrillo to call back.

## 2017-01-10 MED ORDER — CARBIDOPA-LEVODOPA 25-250 MG PO TABS: ORAL_TABLET | ORAL | 0 refills | Status: DC

## 2017-01-10 MED FILL — CARBIDOPA-LEVODOPA 25-250 T: 25-250 | 30 days supply | Qty: 270 | Fill #0

## 2017-01-10 NOTE — Telephone Encounter (Signed)
Spoke with Adelina Mings, she states pharmacy only filled 240 fills for the month.   I advised we did send the wrong number electronically, but called and clarified directions and number of pills. I did sent a new RX to reflect the 270 tablets per month he should be getting.   She will follow up with the pharmacy.

## 2017-01-11 ENCOUNTER — Telehealth: Payer: Self-pay | Admitting: Internal Medicine

## 2017-01-11 NOTE — Telephone Encounter (Signed)
Will forward to pcp
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
## 2017-01-12 NOTE — Telephone Encounter (Signed)
At the request of Dr Laural Benes, call returned to Maren Reamer, [facility] Nurse # [phone].  A HIPAA compliant voicemail message was left requesting a call back to # [phone]

## 2017-01-15 ENCOUNTER — Telehealth: Payer: Self-pay
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
## 2017-01-25 ENCOUNTER — Ambulatory Visit: Payer: Self-pay | Admitting: Internal Medicine

## 2017-01-26 MED FILL — CARBIDOPA-LEVO ER 50-200 TA: 50-200 | 30 days supply | Qty: 30 | Fill #1

## 2017-01-26 NOTE — Telephone Encounter (Signed)
Attempted to contact Maren Reamer, Community Nurse # [phone] to inform her that the patient did not come to his appointment yesterday. Voicemail message left requesting a call back to # [phone]

## 2017-01-30 ENCOUNTER — Other Ambulatory Visit: Payer: Self-pay | Admitting: Physician Assistant

## 2017-01-30 DIAGNOSIS — E118 Type 2 diabetes mellitus with unspecified complications: Secondary | ICD-10-CM

## 2017-01-30 MED FILL — CARBIDOPA-LEVODOPA 25-250 T: 25-250 | 30 days supply | Qty: 270 | Fill #1

## 2017-01-31 MED FILL — MELOXICAM 7.5 MG TABLET: 7.5 | 30 days supply | Qty: 30 | Fill #0

## 2017-02-02 ENCOUNTER — Telehealth: Payer: Self-pay

## 2017-02-02 NOTE — Telephone Encounter (Signed)
Call placed to Maren Reamer, Community Nurse and informed her that the patient missed his appointment last week and has rescheduled to 03/22/17.  She explained that the Center for [facility] is working with the family to try to identify a neighbor who can assist with his care.

## 2017-02-20 MED FILL — CARBIDOPA-LEVO ER 50-200 TA: 50-200 | 30 days supply | Qty: 30 | Fill #2

## 2017-02-23 MED FILL — CARBIDOPA-LEVODOPA 25-250 T: 25-250 | 30 days supply | Qty: 270 | Fill #2

## 2017-03-19 ENCOUNTER — Telehealth: Payer: Self-pay | Admitting: Internal Medicine

## 2017-03-19 DIAGNOSIS — G20 Parkinson's disease: Secondary | ICD-10-CM

## 2017-03-19 NOTE — Telephone Encounter (Signed)
Pt called to request a refill for carbidopa-levodopa (SINEMET CR) 50-200 MG tablet  carbidopa-levodopa (SINEMET IR) 25-250 MG tablet [facility] Please follow up

## 2017-03-20 MED ORDER — CARBIDOPA-LEVODOPA 25-250 MG PO TABS: ORAL_TABLET | ORAL | 0 refills | Status: DC

## 2017-03-20 MED ORDER — CARBIDOPA-LEVODOPA ER 50-200 MG PO TBCR: 1.0000 | EXTENDED_RELEASE_TABLET | Freq: Every day | ORAL | 0 refills | Status: DC

## 2017-03-20 MED FILL — CARBIDOPA-LEVODOPA 25-250 T: 25-250 | 30 days supply | Qty: 270 | Fill #0

## 2017-03-20 MED FILL — CARBIDOPA-LEVO ER 50-200 TA: 50-200 | 30 days supply | Qty: 30 | Fill #3

## 2017-03-20 NOTE — Telephone Encounter (Signed)
Refilled

## 2017-03-22 ENCOUNTER — Ambulatory Visit: Payer: Self-pay | Attending: Internal Medicine | Admitting: Internal Medicine

## 2017-03-22 ENCOUNTER — Encounter: Payer: Self-pay | Admitting: Internal Medicine

## 2017-03-22 VITALS — BP 96/84 | HR 120 | Temp 98.6°F | Resp 18 | Ht 67.0 in | Wt 164.0 lb

## 2017-03-22 DIAGNOSIS — Z9181 History of falling: Secondary | ICD-10-CM | POA: Insufficient documentation

## 2017-03-22 DIAGNOSIS — Z791 Long term (current) use of non-steroidal anti-inflammatories (NSAID): Secondary | ICD-10-CM | POA: Insufficient documentation

## 2017-03-22 DIAGNOSIS — Z79899 Other long term (current) drug therapy: Secondary | ICD-10-CM | POA: Insufficient documentation

## 2017-03-22 DIAGNOSIS — Z87891 Personal history of nicotine dependence: Secondary | ICD-10-CM | POA: Insufficient documentation

## 2017-03-22 DIAGNOSIS — I252 Old myocardial infarction: Secondary | ICD-10-CM | POA: Insufficient documentation

## 2017-03-22 DIAGNOSIS — G20 Parkinson's disease: Secondary | ICD-10-CM | POA: Insufficient documentation

## 2017-03-22 DIAGNOSIS — Z23 Encounter for immunization: Secondary | ICD-10-CM | POA: Insufficient documentation

## 2017-03-22 DIAGNOSIS — E118 Type 2 diabetes mellitus with unspecified complications: Secondary | ICD-10-CM | POA: Insufficient documentation

## 2017-03-22 DIAGNOSIS — K5900 Constipation, unspecified: Secondary | ICD-10-CM | POA: Insufficient documentation

## 2017-03-22 DIAGNOSIS — H538 Other visual disturbances: Secondary | ICD-10-CM | POA: Insufficient documentation

## 2017-03-22 DIAGNOSIS — Z833 Family history of diabetes mellitus: Secondary | ICD-10-CM | POA: Insufficient documentation

## 2017-03-22 DIAGNOSIS — Z1211 Encounter for screening for malignant neoplasm of colon: Secondary | ICD-10-CM

## 2017-03-22 DIAGNOSIS — F329 Major depressive disorder, single episode, unspecified: Secondary | ICD-10-CM | POA: Insufficient documentation

## 2017-03-22 MED ORDER — POLYETHYLENE GLYCOL 3350 17 G PO PACK: 17.0000 g | PACK | Freq: Every day | ORAL | 6 refills | Status: AC

## 2017-03-22 MED ORDER — CARBIDOPA-LEVODOPA 25-250 MG PO TABS: ORAL_TABLET | ORAL | 5 refills | Status: DC

## 2017-03-22 MED FILL — POLYETHYLENE GLYCOL 3350 PO: 14 days supply | Qty: 238 | Fill #0

## 2017-03-22 NOTE — Progress Notes (Signed)
Patient ID: Brandon Carrillo, male    DOB: 08/26/1945  MRN: [id number]  CC: Diabetes   Subjective: Brandon Carrillo is a 71 y.o. male who presents for chronic ds management. Son-in-law, Carola FrostJoal, is with him.  His concerns today include:  Patient with history of Parkinson's, diabetes type 2, depression.  1. Parkinson's: Reports compliance with Sinemet.  Mirapex was discontinued by his neurologist on last visit. -taking Sinemet QID. Suppose to take another dose at bedtime but instead taking at 3 a.m -Patient denies any dizziness, fatigue.  No recent falls -c/o blurred vision for a while.  Would like to have eye exam but no insurance -Reportedly living with her son.  Reports good appetite.   2. DM: Being observed off med. He reports that some worker comes to his house twice a month and blood sugars are checked at that time.  Blood sugars have been good   Patient Active Problem List   Diagnosis Date Noted  . Generalized weakness 09/04/2016  . Onychomycosis of toenail 09/17/2015  . Constipation 09/17/2015  . Bilateral knee pain 09/17/2015  . Other fatigue 09/17/2015  . Vitamin D deficiency 09/17/2015  . Pain, dental 09/16/2015  . Diabetes type 2, controlled (HCC) 03/02/2015  . Fall at home 03/02/2015  . At high risk for falls 03/02/2015  . NSTEMI (non-ST elevated myocardial infarction) (HCC) 08/30/2014  . Parkinson's disease (HCC) 10/21/2013  . Depression 10/21/2013  . Midline thoracic back pain 10/21/2013     Current Outpatient Medications on File Prior to Visit  Medication Sig Dispense Refill  . carbidopa-levodopa (SINEMET CR) 50-200 MG tablet Take 1 tablet by mouth at bedtime. 30 tablet 0  . carbidopa-levodopa (SINEMET IR) 25-250 MG tablet 2 tablets QID and 1 extra a day PRN 270 tablet 0  . meloxicam (MOBIC) 7.5 MG tablet TAKE 1 TABLET BY MOUTH DAILY. 30 tablet 3  . Multiple Vitamin (MULTIVITAMIN) tablet Take 1 tablet by mouth daily.    . polyethylene glycol  (MIRALAX / GLYCOLAX) packet Take 17 g by mouth daily. 14 each 0  . pramipexole (MIRAPEX) 0.25 MG tablet Take 1 tablet (0.25 mg total) by mouth 3 (three) times daily. 270 tablet 1   No current facility-administered medications on file prior to visit.     No Known Allergies  Social History   Socioeconomic History  . Marital status: Widowed    Spouse name: Not on file  . Number of children: Not on file  . Years of education: Not on file  . Highest education level: Not on file  Social Needs  . Financial resource strain: Not on file  . Food insecurity - worry: Not on file  . Food insecurity - inability: Not on file  . Transportation needs - medical: Not on file  . Transportation needs - non-medical: Not on file  Occupational History  . Not on file  Tobacco Use  . Smoking status: Former Games developermoker  . Smokeless tobacco: Never Used  . Tobacco comment: as a teenager  Substance and Sexual Activity  . Alcohol use: No  . Drug use: No  . Sexual activity: Yes  Other Topics Concern  . Not on file  Social History Narrative  . Not on file    Family History  Problem Relation Age of Onset  . Diabetes Mother     Past Surgical History:  Procedure Laterality Date  . LEG SURGERY      ROS: Review of Systems  Constitutional:  Good appetite.  Sleeping okay.  Respiratory: Negative for chest tightness and shortness of breath.   Cardiovascular: Negative for chest pain.  Gastrointestinal: Positive for constipation.  Neurological: Negative for dizziness, syncope and headaches.    PHYSICAL EXAM: BP 96/84 (BP Location: Right Arm, Patient Position: Sitting, Cuff Size: Normal)   Pulse (!) 120   Temp 98.6 F (37 C) (Oral)   Resp 18   Ht 5\' 7"  (1.702 m)   Wt 164 lb (74.4 kg)   SpO2 98%   BMI 25.69 kg/m   Wt Readings from Last 3 Encounters:  03/22/17 164 lb (74.4 kg)  12/18/16 172 lb (78 kg)  11/23/16 164 lb (74.4 kg)    Physical Exam  General appearance -pleasant elderly male  in NAD  mental status -flat affect.  Answers questions appropriately.   Eyes -pink conjunctiva  mouth -oral mucosa moist n Neck - supple, no significant adenopathy Chest - clear to auscultation, no wheezes, rales or rhonchi, symmetric air entry Heart - normal rate, regular rhythm, normal S1, S2, no murmurs, rubs, clicks or gallops Neurological -there is dyskinesia with movement of both arms Extremities -no lower extremity edema  ASSESSMENT AND PLAN: 1. Parkinson's disease (HCC) Patient to keep follow-up appointment with neurologist next month - carbidopa-levodopa (SINEMET IR) 25-250 MG tablet; 2 tablets QID and 1 extra a day PRN  Dispense: 270 tablet; Refill: 5  2. Controlled type 2 diabetes mellitus with complication, without long-term current use of insulin (HCC) -Continue to observe off medications - Hemoglobin A1c  3. Constipation, unspecified constipation type - polyethylene glycol (MIRALAX / GLYCOLAX) packet; Take 17 g by mouth daily.  Dispense: 100 each; Refill: 6  4. Blurred vision Told the son-in-law [facility]  5. Need for influenza vaccination - Flu Vaccine QUAD 6+ mos PF IM (Fluarix Quad PF)  6. Colon cancer screening - Fecal occult blood, imunochemical   Patient was given the opportunity to ask questions.  Patient verbalized understanding of the plan and was able to repeat key elements of the plan.  [employer] interpreter used during this encounter.  No orders of the defined types were placed in this encounter.    Requested Prescriptions    No prescriptions requested or ordered in this encounter    Follow-up in 3 months Jonah Blueeborah Jermari Tamargo, MD, Jerrel IvoryFACP

## 2017-03-22 NOTE — Patient Instructions (Signed)
Keep appointment with Neurologist next month.

## 2017-03-23 LAB — HEMOGLOBIN A1C
Est. average glucose Bld gHb Est-mCnc: 137 mg/dL
Hgb A1c MFr Bld: 6.4 % — ABNORMAL HIGH (ref 4.8–5.6)

## 2017-03-27 ENCOUNTER — Ambulatory Visit: Payer: Self-pay
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
## 2017-04-04 ENCOUNTER — Ambulatory Visit: Payer: Self-pay

## 2017-04-05 ENCOUNTER — Ambulatory Visit: Payer: Self-pay | Admitting: Neurology

## 2017-04-13 MED FILL — CARBIDOPA-LEVODOPA 25-250 T: 25-250 | 30 days supply | Qty: 270 | Fill #0

## 2017-04-13 MED FILL — CARBIDOPA-LEVO ER 50-200 TA: 50-200 | 30 days supply | Qty: 30 | Fill #0

## 2017-04-20 ENCOUNTER — Other Ambulatory Visit: Payer: Self-pay | Admitting: Internal Medicine

## 2017-04-20 DIAGNOSIS — G20 Parkinson's disease: Secondary | ICD-10-CM

## 2017-05-14 MED FILL — CARBIDOPA-LEVODOPA 25-250 T: 25-250 | 30 days supply | Qty: 270 | Fill #1

## 2017-05-14 MED FILL — CARBIDOPA-LEVO ER 50-200 TA: 50-200 | 30 days supply | Qty: 30 | Fill #4

## 2017-06-11 MED FILL — CARBIDOPA-LEVO ER 50-200 TA: 50-200 | 30 days supply | Qty: 30 | Fill #5

## 2017-06-11 MED FILL — CARBIDOPA-LEVODOPA 25-250 T: 25-250 | 30 days supply | Qty: 270 | Fill #2

## 2017-07-05 MED FILL — CARBIDOPA-LEVODOPA 25-250 T: 25-250 | 30 days supply | Qty: 270 | Fill #3

## 2017-07-05 MED FILL — CARBIDOPA-LEVO ER 50-200 TA: 50-200 | 30 days supply | Qty: 30 | Fill #6

## 2017-08-03 MED FILL — CARBIDOPA-LEVODOPA 25-250 T: 25-250 | 30 days supply | Qty: 270 | Fill #4

## 2017-08-03 MED FILL — CARBIDOPA-LEVO ER 50-200 TA: 50-200 | 30 days supply | Qty: 30 | Fill #0

## 2017-08-30 MED FILL — CARBIDOPA-LEVODOPA 25-250 T: 25-250 | 30 days supply | Qty: 270 | Fill #5

## 2017-08-31 ENCOUNTER — Other Ambulatory Visit: Payer: Self-pay

## 2017-08-31 DIAGNOSIS — G20 Parkinson's disease: Secondary | ICD-10-CM

## 2017-08-31 MED ORDER — CARBIDOPA-LEVODOPA 25-250 MG PO TABS: ORAL_TABLET | ORAL | 0 refills | Status: DC

## 2017-09-03 ENCOUNTER — Other Ambulatory Visit: Payer: Self-pay

## 2017-09-03 DIAGNOSIS — G20 Parkinson's disease: Secondary | ICD-10-CM

## 2017-09-03 MED ORDER — CARBIDOPA-LEVODOPA ER 50-200 MG PO TBCR: 1.0000 | EXTENDED_RELEASE_TABLET | Freq: Every day | ORAL | 0 refills | Status: DC

## 2017-09-04 ENCOUNTER — Telehealth: Payer: Self-pay | Admitting: Internal Medicine

## 2017-09-04 NOTE — Telephone Encounter (Signed)
Called patient on both phone numbers on file to schedule a f/u appointment with PCP. Patient did not pick up and VM was full.

## 2017-09-18 MED FILL — CARBIDOPA-LEVODOPA 25-250 T: 25-250 | 30 days supply | Qty: 270 | Fill #0

## 2017-09-18 MED FILL — CARBIDOPA-LEVO ER 50-200 TA: 50-200 | 30 days supply | Qty: 30 | Fill #0

## 2017-09-24 ENCOUNTER — Ambulatory Visit: Payer: Self-pay | Attending: Family Medicine

## 2017-10-19 ENCOUNTER — Ambulatory Visit: Payer: Self-pay | Attending: Internal Medicine | Admitting: Internal Medicine

## 2017-10-19 ENCOUNTER — Encounter: Payer: Self-pay | Admitting: Internal Medicine

## 2017-10-19 VITALS — BP 112/65 | HR 67 | Temp 98.8°F | Resp 16 | Wt 174.6 lb

## 2017-10-19 DIAGNOSIS — Z9181 History of falling: Secondary | ICD-10-CM | POA: Insufficient documentation

## 2017-10-19 DIAGNOSIS — F329 Major depressive disorder, single episode, unspecified: Secondary | ICD-10-CM | POA: Insufficient documentation

## 2017-10-19 DIAGNOSIS — Z79899 Other long term (current) drug therapy: Secondary | ICD-10-CM | POA: Insufficient documentation

## 2017-10-19 DIAGNOSIS — Z791 Long term (current) use of non-steroidal anti-inflammatories (NSAID): Secondary | ICD-10-CM | POA: Insufficient documentation

## 2017-10-19 DIAGNOSIS — Z87891 Personal history of nicotine dependence: Secondary | ICD-10-CM | POA: Insufficient documentation

## 2017-10-19 DIAGNOSIS — E559 Vitamin D deficiency, unspecified: Secondary | ICD-10-CM | POA: Insufficient documentation

## 2017-10-19 DIAGNOSIS — G20 Parkinson's disease: Secondary | ICD-10-CM

## 2017-10-19 DIAGNOSIS — I252 Old myocardial infarction: Secondary | ICD-10-CM | POA: Insufficient documentation

## 2017-10-19 DIAGNOSIS — E118 Type 2 diabetes mellitus with unspecified complications: Secondary | ICD-10-CM

## 2017-10-19 LAB — POCT GLYCOSYLATED HEMOGLOBIN (HGB A1C): HBA1C, POC (PREDIABETIC RANGE): 6.2 % (ref 5.7–6.4)

## 2017-10-19 LAB — GLUCOSE, POCT (MANUAL RESULT ENTRY): POC Glucose: 119 mg/dl — AB (ref 70–99)

## 2017-10-19 MED ORDER — CARBIDOPA-LEVODOPA 25-250 MG PO TABS: ORAL_TABLET | ORAL | 6 refills | Status: DC

## 2017-10-19 MED ORDER — CARBIDOPA-LEVODOPA ER 50-200 MG PO TBCR: 1.0000 | EXTENDED_RELEASE_TABLET | Freq: Every day | ORAL | 6 refills | Status: DC

## 2017-10-19 NOTE — Progress Notes (Signed)
Patient ID: Brandon Carrillo, male    DOB: 01/25/1946  MRN: [id number]  CC: Diabetes   Subjective: Brandon Carrillo is a 72 y.o. male who presents for chronic ds management.  Daughter-in-law, Brandon Carrillo, is with him His concerns today include:  Patient with history of Parkinson's, diabetes type 2, depression.    Parkinson's: lives with son and daughter-in-law. -reports compliance with Sinemet.  He reports what sounds like a wearing off effect stating that by late afternoon into the evening he is shaking increases.  He has not seen his neurologist in a while.  He is needing refills on this medication.  He denies any dizziness.  Denies any frequent falls.  Daughter-in-law states that he had a minor fall a few weeks ago with no injury.  Reports good appetite. He did not bring meds with him  Diabetes: Diet controlled.  He does not check blood sugars. Patient Active Problem List   Diagnosis Date Noted  . Generalized weakness 09/04/2016  . Onychomycosis of toenail 09/17/2015  . Constipation 09/17/2015  . Bilateral knee pain 09/17/2015  . Other fatigue 09/17/2015  . Vitamin D deficiency 09/17/2015  . Pain, dental 09/16/2015  . Diabetes type 2, controlled (HCC) 03/02/2015  . Fall at home 03/02/2015  . At high risk for falls 03/02/2015  . NSTEMI (non-ST elevated myocardial infarction) (HCC) 08/30/2014  . Parkinson's disease (HCC) 10/21/2013  . Depression 10/21/2013  . Midline thoracic back pain 10/21/2013     Current Outpatient Medications on File Prior to Visit  Medication Sig Dispense Refill  . meloxicam (MOBIC) 7.5 MG tablet TAKE 1 TABLET BY MOUTH DAILY. 30 tablet 3  . Multiple Vitamin (MULTIVITAMIN) tablet Take 1 tablet by mouth daily.    . polyethylene glycol (MIRALAX / GLYCOLAX) packet Take 17 g by mouth daily. 100 each 6   No current facility-administered medications on file prior to visit.     No Known Allergies  Social History   Socioeconomic History  . Marital  status: Widowed    Spouse name: Not on file  . Number of children: Not on file  . Years of education: Not on file  . Highest education level: Not on file  Occupational History  . Not on file  Social Needs  . Financial resource strain: Not on file  . Food insecurity:    Worry: Not on file    Inability: Not on file  . Transportation needs:    Medical: Not on file    Non-medical: Not on file  Tobacco Use  . Smoking status: Former Games developermoker  . Smokeless tobacco: Never Used  . Tobacco comment: as a teenager  Substance and Sexual Activity  . Alcohol use: No  . Drug use: No  . Sexual activity: Yes  Lifestyle  . Physical activity:    Days per week: Not on file    Minutes per session: Not on file  . Stress: Not on file  Relationships  . Social connections:    Talks on phone: Not on file    Gets together: Not on file    Attends religious service: Not on file    Active member of club or organization: Not on file    Attends meetings of clubs or organizations: Not on file    Relationship status: Not on file  . Intimate partner violence:    Fear of current or ex partner: Not on file    Emotionally abused: Not on file    Physically abused: Not on file  Forced sexual activity: Not on file  Other Topics Concern  . Not on file  Social History Narrative  . Not on file    Family History  Problem Relation Age of Onset  . Diabetes Mother     Past Surgical History:  Procedure Laterality Date  . LEG SURGERY      ROS: Review of Systems  PHYSICAL EXAM: BP 112/65   Pulse 67   Temp 98.8 F (37.1 C) (Oral)   Resp 16   Wt 174 lb 9.6 oz (79.2 kg)   SpO2 98%   BMI 27.35 kg/m   Wt Readings from Last 3 Encounters:  10/19/17 174 lb 9.6 oz (79.2 kg)  03/22/17 164 lb (74.4 kg)  12/18/16 172 lb (78 kg)    Physical Exam  General appearance -pleasant elderly male in NAD  mental status -flat affect.  Answers questions appropriately.   Eyes -pink conjunctiva  mouth -oral mucosa  moist n Neck - supple, no significant adenopathy Chest - clear to auscultation, no wheezes, rales or rhonchi, symmetric air entry Heart - normal rate, regular rhythm, normal S1, S2, no murmurs, rubs, clicks or gallops Neurological -he has a stooped posture.  Decreased arm swing with walking.  He walks without assistive device Extremities -no lower extremity edema    Results for orders placed or performed in visit on 10/19/17  POCT glucose (manual entry)  Result Value Ref Range   POC Glucose 119 (A) 70 - 99 mg/dl  POCT glycosylated hemoglobin (Hb A1C)  Result Value Ref Range   Hemoglobin A1C  4.0 - 5.6 %   HbA1c POC (<> result, manual entry)  4.0 - 5.6 %   HbA1c, POC (prediabetic range) 6.2 5.7 - 6.4 %   HbA1c, POC (controlled diabetic range)  0.0 - 7.0 %    ASSESSMENT AND PLAN: 1. Parkinson's disease (HCC) - carbidopa-levodopa (SINEMET CR) 50-200 MG tablet; Take 1 tablet by mouth at bedtime.  Dispense: 30 tablet; Refill: 6 - carbidopa-levodopa (SINEMET IR) 25-250 MG tablet; 2 tablets QID and 1 extra a day PRN  Dispense: 270 tablet; Refill: 6 - Ambulatory referral to Neurology  2. Controlled type 2 diabetes mellitus with complication, without long-term current use of insulin (HCC) - POCT glucose (manual entry) - POCT glycosylated hemoglobin (Hb A1C) - CBC; Future - Comprehensive metabolic panel; Future - Lipid panel; Future    Patient was given the opportunity to ask questions.  Patient verbalized understanding of the plan and was able to repeat key elements of the plan.   Orders Placed This Encounter  Procedures  . CBC  . Comprehensive metabolic panel  . Lipid panel  . Ambulatory referral to Neurology  . POCT glucose (manual entry)  . POCT glycosylated hemoglobin (Hb A1C)     Requested Prescriptions   Signed Prescriptions Disp Refills  . carbidopa-levodopa (SINEMET CR) 50-200 MG tablet 30 tablet 6    Sig: Take 1 tablet by mouth at bedtime.  . carbidopa-levodopa  (SINEMET IR) 25-250 MG tablet 270 tablet 6    Sig: 2 tablets QID and 1 extra a day PRN    Return in about 6 months (around 04/20/2018).  Jonah Blue, MD, FACP

## 2017-10-20 MED FILL — CARBIDOPA-LEVODOPA 25-250 T: 25-250 | 30 days supply | Qty: 270 | Fill #0

## 2017-10-20 MED FILL — CARBIDOPA-LEVO ER 50-200 TA: 50-200 | 30 days supply | Qty: 30 | Fill #0

## 2017-10-24 ENCOUNTER — Ambulatory Visit: Payer: Self-pay | Attending: Family Medicine

## 2017-10-24 DIAGNOSIS — D649 Anemia, unspecified: Secondary | ICD-10-CM

## 2017-10-24 DIAGNOSIS — E118 Type 2 diabetes mellitus with unspecified complications: Secondary | ICD-10-CM | POA: Insufficient documentation

## 2017-10-24 NOTE — Progress Notes (Signed)
Patient here for lab visit  

## 2017-10-25 LAB — LIPID PANEL
CHOL/HDL RATIO: 3.3 ratio (ref 0.0–5.0)
Cholesterol, Total: 144 mg/dL (ref 100–199)
HDL: 43 mg/dL (ref >39)
LDL CALC: 87 mg/dL (ref 0–99)
Triglycerides: 69 mg/dL (ref 0–149)
VLDL CHOLESTEROL CAL: 14 mg/dL (ref 5–40)

## 2017-10-25 LAB — COMPREHENSIVE METABOLIC PANEL
ALK PHOS: 72 IU/L (ref 39–117)
ALT: 9 IU/L (ref 0–44)
AST: 16 IU/L (ref 0–40)
Albumin/Globulin Ratio: 1.2 (ref 1.2–2.2)
Albumin: 3.7 g/dL (ref 3.5–4.8)
BUN/Creatinine Ratio: 23 (ref 10–24)
BUN: 15 mg/dL (ref 8–27)
Bilirubin Total: 0.3 mg/dL (ref 0.0–1.2)
CO2: 24 mmol/L (ref 20–29)
CREATININE: 0.64 mg/dL — AB (ref 0.76–1.27)
Calcium: 9 mg/dL (ref 8.6–10.2)
Chloride: 103 mmol/L (ref 96–106)
GFR calc Af Amer: 115 mL/min/{1.73_m2} (ref >59)
GFR calc non Af Amer: 99 mL/min/{1.73_m2} (ref >59)
GLOBULIN, TOTAL: 3.2 g/dL (ref 1.5–4.5)
GLUCOSE: 131 mg/dL — AB (ref 65–99)
Potassium: 4.4 mmol/L (ref 3.5–5.2)
SODIUM: 139 mmol/L (ref 134–144)
Total Protein: 6.9 g/dL (ref 6.0–8.5)

## 2017-10-25 LAB — CBC
Hematocrit: 35.3 % — ABNORMAL LOW (ref 37.5–51.0)
Hemoglobin: 12.3 g/dL — ABNORMAL LOW (ref 13.0–17.7)
MCH: 31.5 pg (ref 26.6–33.0)
MCHC: 34.8 g/dL (ref 31.5–35.7)
MCV: 90 fL (ref 79–97)
Platelets: 295 10*3/uL (ref 150–450)
RBC: 3.91 x10E6/uL — AB (ref 4.14–5.80)
RDW: 14.4 % (ref 12.3–15.4)
WBC: 9.4 10*3/uL (ref 3.4–10.8)

## 2017-10-26 ENCOUNTER — Other Ambulatory Visit: Payer: Self-pay | Admitting: Internal Medicine

## 2017-10-26 DIAGNOSIS — D649 Anemia, unspecified: Secondary | ICD-10-CM

## 2017-10-26 NOTE — Addendum Note (Signed)
Addended by: Paschal DoppWHITE, VANESSA J on: 10/26/2017 01:58 PM   Modules accepted: Orders

## 2017-10-28 LAB — IRON,TIBC AND FERRITIN PANEL
FERRITIN: 63 ng/mL (ref 30–400)
IRON SATURATION: 32 % (ref 15–55)
Iron: 85 ug/dL (ref 38–169)
Total Iron Binding Capacity: 268 ug/dL (ref 250–450)
UIBC: 183 ug/dL (ref 111–343)

## 2017-10-28 LAB — SPECIMEN STATUS REPORT

## 2017-11-15 MED FILL — CARBIDOPA-LEVO ER 50-200 TA: 50-200 | 30 days supply | Qty: 30 | Fill #1

## 2017-11-15 MED FILL — CARBIDOPA-LEVODOPA 25-250 T: 25-250 | 30 days supply | Qty: 270 | Fill #1

## 2017-12-10 MED FILL — CARBIDOPA-LEVODOPA 25-250 T: 25-250 | 30 days supply | Qty: 270 | Fill #2

## 2017-12-10 MED FILL — CARBIDOPA-LEVO ER 50-200 TA: 50-200 | 30 days supply | Qty: 30 | Fill #2

## 2017-12-25 ENCOUNTER — Ambulatory Visit: Payer: Self-pay | Admitting: Neurology

## 2018-01-04 MED FILL — CARBIDOPA-LEVO ER 50-200 TA: 50-200 | 30 days supply | Qty: 30 | Fill #3

## 2018-01-04 MED FILL — CARBIDOPA-LEVODOPA 25-250 T: 25-250 | 30 days supply | Qty: 270 | Fill #3

## 2018-01-06 ENCOUNTER — Emergency Department (HOSPITAL_COMMUNITY): Payer: Self-pay

## 2018-01-06 ENCOUNTER — Encounter (HOSPITAL_COMMUNITY): Payer: Self-pay | Admitting: Emergency Medicine

## 2018-01-06 ENCOUNTER — Emergency Department (HOSPITAL_COMMUNITY)
Admission: EM | Admit: 2018-01-06 | Discharge: 2018-01-07 | Disposition: A | Discharge status: Home / Self Care | Payer: Self-pay | Attending: Emergency Medicine | Admitting: Emergency Medicine

## 2018-01-06 DIAGNOSIS — Z79899 Other long term (current) drug therapy: Secondary | ICD-10-CM | POA: Insufficient documentation

## 2018-01-06 DIAGNOSIS — E119 Type 2 diabetes mellitus without complications: Secondary | ICD-10-CM | POA: Insufficient documentation

## 2018-01-06 DIAGNOSIS — G20 Parkinson's disease: Secondary | ICD-10-CM | POA: Insufficient documentation

## 2018-01-06 DIAGNOSIS — W010XXA Fall on same level from slipping, tripping and stumbling without subsequent striking against object, initial encounter: Secondary | ICD-10-CM

## 2018-01-06 DIAGNOSIS — Z87891 Personal history of nicotine dependence: Secondary | ICD-10-CM | POA: Insufficient documentation

## 2018-01-06 LAB — URINALYSIS, ROUTINE W REFLEX MICROSCOPIC
BILIRUBIN URINE: NEGATIVE
GLUCOSE, UA: NEGATIVE mg/dL
HGB URINE DIPSTICK: NEGATIVE
KETONES UR: NEGATIVE mg/dL
Leukocytes, UA: NEGATIVE
Nitrite: NEGATIVE
PROTEIN: NEGATIVE mg/dL
Specific Gravity, Urine: 1.001 — ABNORMAL LOW (ref 1.005–1.030)
pH: 7 (ref 5.0–8.0)

## 2018-01-06 LAB — COMPREHENSIVE METABOLIC PANEL
ALT: 6 U/L (ref 0–44)
AST: 17 U/L (ref 15–41)
Albumin: 3.7 g/dL (ref 3.5–5.0)
Alkaline Phosphatase: 74 U/L (ref 38–126)
Anion gap: 6 (ref 5–15)
BILIRUBIN TOTAL: 0.5 mg/dL (ref 0.3–1.2)
BUN: 10 mg/dL (ref 8–23)
CALCIUM: 8.9 mg/dL (ref 8.9–10.3)
CO2: 26 mmol/L (ref 22–32)
Chloride: 108 mmol/L (ref 98–111)
Creatinine, Ser: 0.56 mg/dL — ABNORMAL LOW (ref 0.61–1.24)
GFR calc Af Amer: >60 mL/min (ref >60)
GLUCOSE: 105 mg/dL — AB (ref 70–99)
POTASSIUM: 3.5 mmol/L (ref 3.5–5.1)
Sodium: 140 mmol/L (ref 135–145)
TOTAL PROTEIN: 7.7 g/dL (ref 6.5–8.1)

## 2018-01-06 LAB — CBG MONITORING, ED: Glucose-Capillary: 115 mg/dL — ABNORMAL HIGH (ref 70–99)

## 2018-01-06 LAB — CBC
HEMATOCRIT: 42.3 % (ref 39.0–52.0)
Hemoglobin: 13.5 g/dL (ref 13.0–17.0)
MCH: 31 pg (ref 26.0–34.0)
MCHC: 31.9 g/dL (ref 30.0–36.0)
MCV: 97 fL (ref 78.0–100.0)
Platelets: 311 10*3/uL (ref 150–400)
RBC: 4.36 MIL/uL (ref 4.22–5.81)
RDW: 13.2 % (ref 11.5–15.5)
WBC: 9.8 10*3/uL (ref 4.0–10.5)

## 2018-01-06 LAB — CK: CK TOTAL: 200 U/L (ref 49–397)

## 2018-01-06 LAB — I-STAT CG4 LACTIC ACID, ED: Lactic Acid, Venous: 0.92 mmol/L (ref 0.5–1.9)

## 2018-01-06 MED ORDER — CARBIDOPA-LEVODOPA 25-250 MG PO TABS
1.0000 | ORAL_TABLET | Freq: Once | ORAL | Status: AC
  Administered 2018-01-06: 1 via ORAL
  Filled 2018-01-06: qty 1

## 2018-01-06 MED ORDER — SODIUM CHLORIDE 0.9 % IV BOLUS
1000.0000 mL | Freq: Once | INTRAVENOUS | Status: AC
  Administered 2018-01-06: 1000 mL via INTRAVENOUS

## 2018-01-06 NOTE — ED Triage Notes (Signed)
Patient arrived from home via ems with a fall. With interpreter, patient reports falling, pain in his left arm and back of his head. Reports of history of parkinson.  Uses walker at baseline EDP is at bedside

## 2018-01-06 NOTE — ED Notes (Signed)
Patient transported to CT 

## 2018-01-06 NOTE — ED Notes (Signed)
Pt speaks very little english. Pt fell and reports pain to head and left arm.

## 2018-01-06 NOTE — ED Provider Notes (Signed)
[facility] EMERGENCY DEPARTMENT Provider Note   CSN: [id number] Arrival date & time: 01/06/18  1755     History   Chief Complaint Chief Complaint  Patient presents with  . Fall    HPI Brandon Carrillo is a 72 y.o. male.  Patient with hx parkinsons disease, presents s/p fall at home. States slipped on wet floor/peice of fruit on floor. Hit head, dazed, no loc. Was unable to get up under own power, on floor for 1-2 hours, called for help, passers by helped him up. Has not eaten today, did take AM meds but none since. At baseline walks w walker. Denies other recent fall. Posterior head and neck pain. No radicular pain. No numbness/weakness. Also c/o left shoulder area pain. Dull, moderate, worse w movement. Denies faintness or dizziness prior to fall.  The history is provided by the patient and the EMS personnel. A language interpreter was used.    Past Medical History:  Diagnosis Date  . Depression   . Diabetes mellitus without complication (HCC)   . Hypercholesteremia   . Parkinson's disease (HCC)   . Parkinson's disease [facility])     Patient Active Problem List   Diagnosis Date Noted  . Generalized weakness 09/04/2016  . Onychomycosis of toenail 09/17/2015  . Constipation 09/17/2015  . Bilateral knee pain 09/17/2015  . Other fatigue 09/17/2015  . Vitamin D deficiency 09/17/2015  . Pain, dental 09/16/2015  . Diabetes type 2, controlled (HCC) 03/02/2015  . Fall at home 03/02/2015  . At high risk for falls 03/02/2015  . NSTEMI (non-ST elevated myocardial infarction) (HCC) 08/30/2014  . Parkinson's disease (HCC) 10/21/2013  . Depression 10/21/2013  . Midline thoracic back pain 10/21/2013    Past Surgical History:  Procedure Laterality Date  . LEG SURGERY          Home Medications    Prior to Admission medications   Medication Sig Start Date End Date Taking? Authorizing Provider  carbidopa-levodopa (SINEMET CR) 50-200 MG tablet Take 1  tablet by mouth at bedtime. 10/19/17   Marcine Matar, MD  carbidopa-levodopa (SINEMET IR) 25-250 MG tablet 2 tablets QID and 1 extra a day PRN 10/19/17   Marcine Matar, MD  meloxicam (MOBIC) 7.5 MG tablet TAKE 1 TABLET BY MOUTH DAILY. 01/31/17   Marcine Matar, MD  Multiple Vitamin (MULTIVITAMIN) tablet Take 1 tablet by mouth daily.    [provider]  polyethylene glycol (MIRALAX / GLYCOLAX) packet Take 17 g by mouth daily. 03/22/17   Marcine Matar, MD    Family History Family History  Problem Relation Age of Onset  . Diabetes Mother     Social History Social History   Tobacco Use  . Smoking status: Former Games developer  . Smokeless tobacco: Never Used  . Tobacco comment: as a teenager  Substance Use Topics  . Alcohol use: No  . Drug use: No     Allergies   Patient has no known allergies.   Review of Systems Review of Systems  Constitutional: Negative for fever.  HENT: Negative for nosebleeds.   Eyes: Negative for pain and visual disturbance.  Respiratory: Negative for shortness of breath.   Cardiovascular: Negative for chest pain.  Gastrointestinal: Negative for abdominal pain and vomiting.  Endocrine: Negative for polyuria.  Genitourinary: Negative for flank pain.  Musculoskeletal: Negative for back pain.  Skin: Negative for wound.  Neurological: Negative for weakness and numbness.  Hematological: Does not bruise/bleed easily.  Psychiatric/Behavioral: Negative for confusion.  Physical Exam Updated Vital Signs There were no vitals taken for this visit.  Physical Exam  Constitutional: He appears well-developed and well-nourished.  HENT:  Mouth/Throat: Oropharynx is clear and moist.  Contusion posterior scalp.   Eyes: Pupils are equal, round, and reactive to light. Conjunctivae are normal.  Neck: Neck supple. No tracheal deviation present. No thyromegaly present.  No bruits.   Cardiovascular: Normal rate, regular rhythm, normal heart  sounds and intact distal pulses. Exam reveals no gallop and no friction rub.  No murmur heard. Pulmonary/Chest: Effort normal and breath sounds normal. No accessory muscle usage. No respiratory distress. He exhibits no tenderness.  Abdominal: Soft. Bowel sounds are normal. He exhibits no distension and no mass. There is no tenderness. There is no rebound and no guarding.  Genitourinary:  Genitourinary Comments: No cva tenderness. Normal external gu exam.   Musculoskeletal: He exhibits no edema.  Mid cervical tenderness, otherwise, CTLS spine, non tender, aligned, no step off. Good rom bil extremities, no focal bony tenderness. Distal pulses palp bil.  Neurological: He is alert.  Speech clear/fluent. Motor intact bil, stre 5/5. sens grossly intact. Resting tremor c/w hx Parkinsons.   Skin: Skin is warm and dry. No rash noted.  Psychiatric: He has a normal mood and affect.  Nursing note and vitals reviewed.    ED Treatments / Results  Labs (all labs ordered are listed, but only abnormal results are displayed) Results for orders placed or performed during the hospital encounter of 01/06/18  CBC  Result Value Ref Range   WBC 9.8 4.0 - 10.5 K/uL   RBC 4.36 4.22 - 5.81 MIL/uL   Hemoglobin 13.5 13.0 - 17.0 g/dL   HCT 16.142.3 09.639.0 - 04.552.0 %   MCV 97.0 78.0 - 100.0 fL   MCH 31.0 26.0 - 34.0 pg   MCHC 31.9 30.0 - 36.0 g/dL   RDW 40.913.2 81.111.5 - 91.415.5 %   Platelets 311 150 - 400 K/uL  Comprehensive metabolic panel  Result Value Ref Range   Sodium 140 135 - 145 mmol/L   Potassium 3.5 3.5 - 5.1 mmol/L   Chloride 108 98 - 111 mmol/L   CO2 26 22 - 32 mmol/L   Glucose, Bld 105 (H) 70 - 99 mg/dL   BUN 10 8 - 23 mg/dL   Creatinine, Ser 7.820.56 (L) 0.61 - 1.24 mg/dL   Calcium 8.9 8.9 - 95.610.3 mg/dL   Total Protein 7.7 6.5 - 8.1 g/dL   Albumin 3.7 3.5 - 5.0 g/dL   AST 17 15 - 41 U/L   ALT 6 0 - 44 U/L   Alkaline Phosphatase 74 38 - 126 U/L   Total Bilirubin 0.5 0.3 - 1.2 mg/dL   GFR calc non Af Amer  >60 >60 mL/min   GFR calc Af Amer >60 >60 mL/min   Anion gap 6 5 - 15  Urinalysis, Routine w reflex microscopic  Result Value Ref Range   Color, Urine COLORLESS (A) YELLOW   APPearance CLEAR CLEAR   Specific Gravity, Urine 1.001 (L) 1.005 - 1.030   pH 7.0 5.0 - 8.0   Glucose, UA NEGATIVE NEGATIVE mg/dL   Hgb urine dipstick NEGATIVE NEGATIVE   Bilirubin Urine NEGATIVE NEGATIVE   Ketones, ur NEGATIVE NEGATIVE mg/dL   Protein, ur NEGATIVE NEGATIVE mg/dL   Nitrite NEGATIVE NEGATIVE   Leukocytes, UA NEGATIVE NEGATIVE  CK  Result Value Ref Range   Total CK 200 49 - 397 U/L  CBG monitoring, ED  Result Value  Ref Range   Glucose-Capillary 115 (H) 70 - 99 mg/dL  I-Stat CG4 Lactic Acid, ED  (not at  [facility])  Result Value Ref Range   Lactic Acid, Venous 0.92 0.5 - 1.9 mmol/L   Dg Chest 2 View  Result Date: 01/06/2018 CLINICAL DATA:  Patient status post fall. Left shoulder pain. Initial encounter. EXAM: CHEST - 2 VIEW COMPARISON:  Chest radiograph 09/04/2016 FINDINGS: Monitoring leads overlie the patient. Stable cardiac and mediastinal contours. Bibasilar atelectasis. No pleural effusion or pneumothorax. Osseous structures unremarkable. IMPRESSION: No acute cardiopulmonary process. Electronically Signed   By: Annia Belt M.D.   On: 01/06/2018 20:35   Ct Head Wo Contrast  Result Date: 01/06/2018 CLINICAL DATA:  Patient status post fall.  Posterior pain. EXAM: CT HEAD WITHOUT CONTRAST CT CERVICAL SPINE WITHOUT CONTRAST TECHNIQUE: Multidetector CT imaging of the head and cervical spine was performed following the standard protocol without intravenous contrast. Multiplanar CT image reconstructions of the cervical spine were also generated. COMPARISON:  Brain and cervical spine CT 08/30/2014 FINDINGS: CT HEAD FINDINGS Brain: Ventricles and sulci are appropriate for patient's age. No evidence for acute cortically based infarct, intracranial hemorrhage, mass lesion or mass-effect. Periventricular and  subcortical white matter hypodensity compatible with chronic microvascular ischemic changes. Motion artifact limits evaluation. Vascular: Unremarkable. Skull: Intact. Sinuses/Orbits: Paranasal sinuses well aerated. Mastoid air cells unremarkable. Orbits are unremarkable. Other: None. CT CERVICAL SPINE FINDINGS Alignment: Reversal of the normal cervical lordosis. Skull base and vertebrae: Intact. Soft tissues and spinal canal: Unremarkable. Disc levels: Degenerative disc disease most pronounced C5-6 and C6-7. No evidence for acute fracture. Upper chest: Unremarkable. Other: Unremarkable. IMPRESSION: No acute intracranial process. No acute cervical spine fracture. Reversal of the normal cervical lordosis. Degenerative disc disease. Electronically Signed   By: Annia Belt M.D.   On: 01/06/2018 20:00   Ct Cervical Spine Wo Contrast  Result Date: 01/06/2018 CLINICAL DATA:  Patient status post fall.  Posterior pain. EXAM: CT HEAD WITHOUT CONTRAST CT CERVICAL SPINE WITHOUT CONTRAST TECHNIQUE: Multidetector CT imaging of the head and cervical spine was performed following the standard protocol without intravenous contrast. Multiplanar CT image reconstructions of the cervical spine were also generated. COMPARISON:  Brain and cervical spine CT 08/30/2014 FINDINGS: CT HEAD FINDINGS Brain: Ventricles and sulci are appropriate for patient's age. No evidence for acute cortically based infarct, intracranial hemorrhage, mass lesion or mass-effect. Periventricular and subcortical white matter hypodensity compatible with chronic microvascular ischemic changes. Motion artifact limits evaluation. Vascular: Unremarkable. Skull: Intact. Sinuses/Orbits: Paranasal sinuses well aerated. Mastoid air cells unremarkable. Orbits are unremarkable. Other: None. CT CERVICAL SPINE FINDINGS Alignment: Reversal of the normal cervical lordosis. Skull base and vertebrae: Intact. Soft tissues and spinal canal: Unremarkable. Disc levels: Degenerative  disc disease most pronounced C5-6 and C6-7. No evidence for acute fracture. Upper chest: Unremarkable. Other: Unremarkable. IMPRESSION: No acute intracranial process. No acute cervical spine fracture. Reversal of the normal cervical lordosis. Degenerative disc disease. Electronically Signed   By: Annia Belt M.D.   On: 01/06/2018 20:00   Dg Shoulder Left  Result Date: 01/06/2018 CLINICAL DATA:  Patient status post fall.  Initial encounter. EXAM: LEFT SHOULDER - 2+ VIEW COMPARISON:  None. FINDINGS: Normal anatomic alignment. No evidence for acute fracture or dislocation. Visualized left hemithorax is unremarkable. IMPRESSION: No acute osseous abnormality. Electronically Signed   By: Annia Belt M.D.   On: 01/06/2018 20:32    EKG None  Radiology Dg Chest 2 View  Result Date: 01/06/2018 CLINICAL DATA:  Patient  status post fall. Left shoulder pain. Initial encounter. EXAM: CHEST - 2 VIEW COMPARISON:  Chest radiograph 09/04/2016 FINDINGS: Monitoring leads overlie the patient. Stable cardiac and mediastinal contours. Bibasilar atelectasis. No pleural effusion or pneumothorax. Osseous structures unremarkable. IMPRESSION: No acute cardiopulmonary process. Electronically Signed   By: Annia Belt M.D.   On: 01/06/2018 20:35   Ct Head Wo Contrast  Result Date: 01/06/2018 CLINICAL DATA:  Patient status post fall.  Posterior pain. EXAM: CT HEAD WITHOUT CONTRAST CT CERVICAL SPINE WITHOUT CONTRAST TECHNIQUE: Multidetector CT imaging of the head and cervical spine was performed following the standard protocol without intravenous contrast. Multiplanar CT image reconstructions of the cervical spine were also generated. COMPARISON:  Brain and cervical spine CT 08/30/2014 FINDINGS: CT HEAD FINDINGS Brain: Ventricles and sulci are appropriate for patient's age. No evidence for acute cortically based infarct, intracranial hemorrhage, mass lesion or mass-effect. Periventricular and subcortical white matter hypodensity  compatible with chronic microvascular ischemic changes. Motion artifact limits evaluation. Vascular: Unremarkable. Skull: Intact. Sinuses/Orbits: Paranasal sinuses well aerated. Mastoid air cells unremarkable. Orbits are unremarkable. Other: None. CT CERVICAL SPINE FINDINGS Alignment: Reversal of the normal cervical lordosis. Skull base and vertebrae: Intact. Soft tissues and spinal canal: Unremarkable. Disc levels: Degenerative disc disease most pronounced C5-6 and C6-7. No evidence for acute fracture. Upper chest: Unremarkable. Other: Unremarkable. IMPRESSION: No acute intracranial process. No acute cervical spine fracture. Reversal of the normal cervical lordosis. Degenerative disc disease. Electronically Signed   By: Annia Belt M.D.   On: 01/06/2018 20:00   Ct Cervical Spine Wo Contrast  Result Date: 01/06/2018 CLINICAL DATA:  Patient status post fall.  Posterior pain. EXAM: CT HEAD WITHOUT CONTRAST CT CERVICAL SPINE WITHOUT CONTRAST TECHNIQUE: Multidetector CT imaging of the head and cervical spine was performed following the standard protocol without intravenous contrast. Multiplanar CT image reconstructions of the cervical spine were also generated. COMPARISON:  Brain and cervical spine CT 08/30/2014 FINDINGS: CT HEAD FINDINGS Brain: Ventricles and sulci are appropriate for patient's age. No evidence for acute cortically based infarct, intracranial hemorrhage, mass lesion or mass-effect. Periventricular and subcortical white matter hypodensity compatible with chronic microvascular ischemic changes. Motion artifact limits evaluation. Vascular: Unremarkable. Skull: Intact. Sinuses/Orbits: Paranasal sinuses well aerated. Mastoid air cells unremarkable. Orbits are unremarkable. Other: None. CT CERVICAL SPINE FINDINGS Alignment: Reversal of the normal cervical lordosis. Skull base and vertebrae: Intact. Soft tissues and spinal canal: Unremarkable. Disc levels: Degenerative disc disease most pronounced C5-6  and C6-7. No evidence for acute fracture. Upper chest: Unremarkable. Other: Unremarkable. IMPRESSION: No acute intracranial process. No acute cervical spine fracture. Reversal of the normal cervical lordosis. Degenerative disc disease. Electronically Signed   By: Annia Belt M.D.   On: 01/06/2018 20:00   Dg Shoulder Left  Result Date: 01/06/2018 CLINICAL DATA:  Patient status post fall.  Initial encounter. EXAM: LEFT SHOULDER - 2+ VIEW COMPARISON:  None. FINDINGS: Normal anatomic alignment. No evidence for acute fracture or dislocation. Visualized left hemithorax is unremarkable. IMPRESSION: No acute osseous abnormality. Electronically Signed   By: Annia Belt M.D.   On: 01/06/2018 20:32    Procedures Procedures (including critical care time)  Medications Ordered in ED Medications  carbidopa-levodopa (SINEMET IR) 25-250 MG per tablet immediate release 1 tablet (has no administration in time range)  sodium chloride 0.9 % bolus 1,000 mL (has no administration in time range)     Initial Impression / Assessment and Plan / ED Course  I have reviewed the triage vital signs and the  nursing notes.  Pertinent labs & imaging results that were available during my care of the patient were reviewed by me and considered in my medical decision making (see chart for details).  Iv ns bolus. cbg normal. Pt requests dose of his Parkinsons med - given.   Reviewed nursing notes and prior charts for additional history.   Imaging reviewed - no acute fracture/injury.  Labs reviewed - chem normal.  Po fluids.  Recheck, pt calm and alert.   Will ambulate in hall.   Pt ambulatory. Is eating and drinking.   Will make SW/home health referral.     Final Clinical Impressions(s) / ED Diagnoses   Final diagnoses:  None    ED Discharge Orders    None       Cathren Laine, MD 01/06/18 2332

## 2018-01-06 NOTE — Discharge Instructions (Addendum)
It was our pleasure to provide your ER care today - we hope that you feel better.  Follow up with your primary care doctor in the next 2-3 days for recheck - call office tomorrow to arrange appointment.   We have made a social work and home health referral  - they should be contacting you in the next 1-2 days.  Return to ER if worse, new symptoms, fevers, new or severe pain, trouble breathing, other concern.

## 2018-01-06 NOTE — ED Notes (Signed)
Gave Pt water did well no problem

## 2018-01-07 ENCOUNTER — Encounter (HOSPITAL_COMMUNITY): Payer: Self-pay | Admitting: Emergency Medicine

## 2018-01-07 ENCOUNTER — Emergency Department (HOSPITAL_COMMUNITY)
Admission: EM | Admit: 2018-01-07 | Discharge: 2018-01-07 | Disposition: A | Discharge status: Home / Self Care | Payer: Self-pay | Attending: Emergency Medicine | Admitting: Emergency Medicine

## 2018-01-07 DIAGNOSIS — Z87891 Personal history of nicotine dependence: Secondary | ICD-10-CM | POA: Insufficient documentation

## 2018-01-07 DIAGNOSIS — Z79899 Other long term (current) drug therapy: Secondary | ICD-10-CM | POA: Insufficient documentation

## 2018-01-07 DIAGNOSIS — Z789 Other specified health status: Secondary | ICD-10-CM

## 2018-01-07 DIAGNOSIS — G20 Parkinson's disease: Secondary | ICD-10-CM | POA: Insufficient documentation

## 2018-01-07 DIAGNOSIS — E119 Type 2 diabetes mellitus without complications: Secondary | ICD-10-CM | POA: Insufficient documentation

## 2018-01-07 DIAGNOSIS — I252 Old myocardial infarction: Secondary | ICD-10-CM | POA: Insufficient documentation

## 2018-01-07 DIAGNOSIS — Z0489 Encounter for examination and observation for other specified reasons: Secondary | ICD-10-CM | POA: Insufficient documentation

## 2018-01-07 DIAGNOSIS — Z09 Encounter for follow-up examination after completed treatment for conditions other than malignant neoplasm: Secondary | ICD-10-CM

## 2018-01-07 LAB — CBG MONITORING, ED: GLUCOSE-CAPILLARY: 182 mg/dL — AB (ref 70–99)

## 2018-01-07 MED ORDER — CARBIDOPA-LEVODOPA 25-250 MG PO TABS
2.0000 | ORAL_TABLET | Freq: Four times a day (QID) | ORAL | Status: DC
  Administered 2018-01-07 (×4): 2 via ORAL
  Filled 2018-01-07 (×6): qty 2

## 2018-01-07 MED ORDER — CARBIDOPA-LEVODOPA ER 50-200 MG PO TBCR
1.0000 | EXTENDED_RELEASE_TABLET | Freq: Every day | ORAL | Status: DC
  Filled 2018-01-07: qty 1

## 2018-01-07 NOTE — ED Notes (Signed)
Patient appears upset , interrupter  Line used patient was wanted to go home , explained to patient we were waiting on caseworker and he calmed down. Linens changed .

## 2018-01-07 NOTE — Progress Notes (Addendum)
CSW aware of consult. Per notes, patient lives with his son and came to the ED after a fall. Per notes, patient was discharged but brought back. Per notes, patient stated his son was not available and was not sure if he would have enough help at home. CSW spoke with son Elson ClanMario Carreno who stated that patient lives with his brother Hubbard HartshornFelix Carreno. CSW reached out to Hubbard HartshornFelix Carreno [phone] and left voicemail for return call. Through chart review, patient has been previously involved with [name]. CSW attempted to get in contact Kandace ParkinsKelsey White at [phone] and left voicemail for return call. CSW will continue to follow.  9:48am- CSW received return call from Kandace ParkinsKelsey White who stated that patient is no longer active with [facility] as patient and patient family was difficult to engage. CSW still awaiting return call from son, Rulon EisenmengerFelix.  12:10pm- CSW received return call from son, Rulon EisenmengerFelix, who confirms that patient lives with him. Per Rulon EisenmengerFelix, he would be by to pick up the patient after he gets off work at 5. CSW has updated Charity fundraiserN.  Archie BalboaMackenzie Irwin, LCSWA  Clinical Social Work Department  [facility]  [id number]

## 2018-01-07 NOTE — ED Triage Notes (Signed)
PT son here to take PT home. Pt in WC .

## 2018-01-07 NOTE — ED Notes (Signed)
Breakfast tray ordered 

## 2018-01-07 NOTE — ED Notes (Signed)
Sitting up in recliner asleep

## 2018-01-07 NOTE — ED Notes (Signed)
Patient status unchanged, still trying to reach sons per Child psychotherapistsocial worker.

## 2018-01-07 NOTE — ED Notes (Signed)
Called pharmacy about missing medications.

## 2018-01-07 NOTE — ED Provider Notes (Signed)
  Physical Exam  BP (!) 154/89   Pulse 85   Resp 18   SpO2 98%   Physical Exam  ED Course/Procedures     Procedures  MDM  Received patient in signout.  Pending social work consult and disposition.       Benjiman CorePickering, Hoa Deriso, MD 01/07/18 1043

## 2018-01-07 NOTE — ED Triage Notes (Addendum)
Pt here with PTAR after they attempted to transport him home after an eval here for injuries from a fall; interpreter was used to explain to patient that he was being transported home and he verbalized understanding but when he was loaded into the back of the ambulance, patient began to cry and became upset; pt brought back to ER for consults; pt states he lives at home alone and his parkinsons is worsening and no one was home to help care for him

## 2018-01-07 NOTE — ED Notes (Signed)
Pt required total assist from this NT with eating breakfast. Pt transferred from bed to sitting in chair next to bed. Interpreter used. Pt stated he needed to use the restroom, and was transferred from chair to wheelchair and transported to restroom. Pt resting in chair at bedside at this time. Call light within reach.

## 2018-01-07 NOTE — ED Notes (Signed)
Patient had a good appetite for breakfast, had to be assisted to eat by ED Tech.

## 2018-01-07 NOTE — ED Notes (Signed)
Turkey sandwich given 

## 2018-01-07 NOTE — ED Notes (Signed)
Lunch at bedside 

## 2018-01-07 NOTE — ED Notes (Signed)
Brandon Carrillo  Social worker  Called attempting to reach son,

## 2018-01-07 NOTE — ED Notes (Signed)
RN introduced herself using interpreter , patient has not complaints at this time advised lunch as been ordered.

## 2018-01-07 NOTE — ED Notes (Signed)
Pt walking in room, redirected to chair

## 2018-01-07 NOTE — ED Notes (Signed)
RN placed urinal between patient legs. Patient still has not urinated. Patient will not allow RN to remove.

## 2018-01-07 NOTE — ED Notes (Signed)
Patient up to recliner, interrupter at bedside . Patient aware we are waiting on social worker.

## 2018-01-07 NOTE — ED Notes (Signed)
Patient verbalizes understanding of discharge instructions. Opportunity for questioning and answers were provided. Armband removed by staff, pt discharged from ED home via PTAR.  

## 2018-01-07 NOTE — ED Notes (Signed)
Lunch tray ordered 

## 2018-01-07 NOTE — ED Provider Notes (Signed)
Brandon Carrillo [facility] EMERGENCY DEPARTMENT Provider Note   CSN: [id number] Arrival date & time: 01/07/18  0210     History   Chief Complaint Chief Complaint  Patient presents with  . Consult    case management/social work    HPI Brandon Carrillo is a 72 y.o. male.  Level 5 caveat for language barrier.  Interpreter used.  Patient just discharged from the ED after being seen after mechanical fall.  EMS brought him back because patient became very upset and tearful on the way home and they could not understand what the issue was.  There is a language barrier and patient was unable to understand what was going on.  Patient had a negative work-up for his fall and was discharged back to his home situation.  He does have a history of Parkinson's disease and diabetes.  With translator at bedside, patient states that he normally lives on a trailer of the property of his son.  His son is not available at this time and he was not certain if he would have enough help at home.  He also says his bed is broken and he has no place to lay down. He denies any new trauma or fall.  No new pain complaint.  The history is provided by the patient and the EMS personnel. The history is limited by the condition of the patient and a language barrier. A language interpreter was used.    Past Medical History:  Diagnosis Date  . Depression   . Diabetes mellitus without complication (HCC)   . Hypercholesteremia   . Parkinson's disease (HCC)   . Parkinson's disease [facility])     Patient Active Problem List   Diagnosis Date Noted  . Generalized weakness 09/04/2016  . Onychomycosis of toenail 09/17/2015  . Constipation 09/17/2015  . Bilateral knee pain 09/17/2015  . Other fatigue 09/17/2015  . Vitamin D deficiency 09/17/2015  . Pain, dental 09/16/2015  . Diabetes type 2, controlled (HCC) 03/02/2015  . Fall at home 03/02/2015  . At high risk for falls 03/02/2015  . NSTEMI (non-ST elevated  myocardial infarction) (HCC) 08/30/2014  . Parkinson's disease (HCC) 10/21/2013  . Depression 10/21/2013  . Midline thoracic back pain 10/21/2013    Past Surgical History:  Procedure Laterality Date  . LEG SURGERY          Home Medications    Prior to Admission medications   Medication Sig Start Date End Date Taking? Authorizing Provider  carbidopa-levodopa (SINEMET IR) 25-250 MG tablet 2 tablets QID and 1 extra a day PRN Patient taking differently: Take 2 tablets by mouth See admin instructions. 2 tablets QID and 1 extra a day PRN 6a, 10a, 12p, and 6p 10/19/17  Yes Marcine Matar, MD  meloxicam (MOBIC) 7.5 MG tablet TAKE 1 TABLET BY MOUTH DAILY. Patient taking differently: Take 7.5 mg by mouth daily.  01/31/17  Yes Marcine Matar, MD  Multiple Vitamin (MULTIVITAMIN) tablet Take 1 tablet by mouth daily.   Yes [provider]  polyethylene glycol (MIRALAX / GLYCOLAX) packet Take 17 g by mouth daily. 03/22/17  Yes Marcine Matar, MD  carbidopa-levodopa (SINEMET CR) 50-200 MG tablet Take 1 tablet by mouth at bedtime. 10/19/17   Marcine Matar, MD    Family History Family History  Problem Relation Age of Onset  . Diabetes Mother     Social History Social History   Tobacco Use  . Smoking status: Former Games developer  . Smokeless tobacco: Never  Used  . Tobacco comment: as a teenager  Substance Use Topics  . Alcohol use: No  . Drug use: No     Allergies   Patient has no known allergies.   Review of Systems Review of Systems  Unable to perform ROS: Other     Physical Exam Updated Vital Signs Pulse 77   Resp (!) 23   SpO2 97%   Physical Exam  Constitutional: He is oriented to person, place, and time. He appears well-developed and well-nourished. No distress.  HENT:  Head: Normocephalic and atraumatic.  Mouth/Throat: Oropharynx is clear and moist. No oropharyngeal exudate.  Eyes: Pupils are equal, round, and reactive to light. Conjunctivae and  EOM are normal.  Neck: Normal range of motion. Neck supple.  No C-spine tenderness  Cardiovascular: Normal rate, regular rhythm, normal heart sounds and intact distal pulses.  No murmur heard. Pulmonary/Chest: Effort normal and breath sounds normal. No respiratory distress.  Abdominal: Soft. There is no tenderness. There is no rebound and no guarding.  Musculoskeletal: Normal range of motion. He exhibits no edema or tenderness.  No T or L-spine tenderness  Full range of motion of hips without pain. No bony tenderness of extremities.  Neurological: He is alert and oriented to person, place, and time. No cranial nerve deficit. He exhibits normal muscle tone. Coordination normal.  Patient oriented to person and place.  Disoriented to time.  5/5 strength throughout, parkinsonian resting tremor present of upper extremities.  Skin: Skin is warm.  Psychiatric: He has a normal mood and affect. His behavior is normal.  Nursing note and vitals reviewed.    ED Treatments / Results  Labs (all labs ordered are listed, but only abnormal results are displayed) Labs Reviewed  CBG MONITORING, ED    EKG None  Radiology Dg Chest 2 View  Result Date: 01/06/2018 CLINICAL DATA:  Patient status post fall. Left shoulder pain. Initial encounter. EXAM: CHEST - 2 VIEW COMPARISON:  Chest radiograph 09/04/2016 FINDINGS: Monitoring leads overlie the patient. Stable cardiac and mediastinal contours. Bibasilar atelectasis. No pleural effusion or pneumothorax. Osseous structures unremarkable. IMPRESSION: No acute cardiopulmonary process. Electronically Signed   By: Annia Belt M.D.   On: 01/06/2018 20:35   Ct Head Wo Contrast  Result Date: 01/06/2018 CLINICAL DATA:  Patient status post fall.  Posterior pain. EXAM: CT HEAD WITHOUT CONTRAST CT CERVICAL SPINE WITHOUT CONTRAST TECHNIQUE: Multidetector CT imaging of the head and cervical spine was performed following the standard protocol without intravenous  contrast. Multiplanar CT image reconstructions of the cervical spine were also generated. COMPARISON:  Brain and cervical spine CT 08/30/2014 FINDINGS: CT HEAD FINDINGS Brain: Ventricles and sulci are appropriate for patient's age. No evidence for acute cortically based infarct, intracranial hemorrhage, mass lesion or mass-effect. Periventricular and subcortical white matter hypodensity compatible with chronic microvascular ischemic changes. Motion artifact limits evaluation. Vascular: Unremarkable. Skull: Intact. Sinuses/Orbits: Paranasal sinuses well aerated. Mastoid air cells unremarkable. Orbits are unremarkable. Other: None. CT CERVICAL SPINE FINDINGS Alignment: Reversal of the normal cervical lordosis. Skull base and vertebrae: Intact. Soft tissues and spinal canal: Unremarkable. Disc levels: Degenerative disc disease most pronounced C5-6 and C6-7. No evidence for acute fracture. Upper chest: Unremarkable. Other: Unremarkable. IMPRESSION: No acute intracranial process. No acute cervical spine fracture. Reversal of the normal cervical lordosis. Degenerative disc disease. Electronically Signed   By: Annia Belt M.D.   On: 01/06/2018 20:00   Ct Cervical Spine Wo Contrast  Result Date: 01/06/2018 CLINICAL DATA:  Patient status post  fall.  Posterior pain. EXAM: CT HEAD WITHOUT CONTRAST CT CERVICAL SPINE WITHOUT CONTRAST TECHNIQUE: Multidetector CT imaging of the head and cervical spine was performed following the standard protocol without intravenous contrast. Multiplanar CT image reconstructions of the cervical spine were also generated. COMPARISON:  Brain and cervical spine CT 08/30/2014 FINDINGS: CT HEAD FINDINGS Brain: Ventricles and sulci are appropriate for patient's age. No evidence for acute cortically based infarct, intracranial hemorrhage, mass lesion or mass-effect. Periventricular and subcortical white matter hypodensity compatible with chronic microvascular ischemic changes. Motion artifact limits  evaluation. Vascular: Unremarkable. Skull: Intact. Sinuses/Orbits: Paranasal sinuses well aerated. Mastoid air cells unremarkable. Orbits are unremarkable. Other: None. CT CERVICAL SPINE FINDINGS Alignment: Reversal of the normal cervical lordosis. Skull base and vertebrae: Intact. Soft tissues and spinal canal: Unremarkable. Disc levels: Degenerative disc disease most pronounced C5-6 and C6-7. No evidence for acute fracture. Upper chest: Unremarkable. Other: Unremarkable. IMPRESSION: No acute intracranial process. No acute cervical spine fracture. Reversal of the normal cervical lordosis. Degenerative disc disease. Electronically Signed   By: Annia Beltrew  Davis M.D.   On: 01/06/2018 20:00   Dg Shoulder Left  Result Date: 01/06/2018 CLINICAL DATA:  Patient status post fall.  Initial encounter. EXAM: LEFT SHOULDER - 2+ VIEW COMPARISON:  None. FINDINGS: Normal anatomic alignment. No evidence for acute fracture or dislocation. Visualized left hemithorax is unremarkable. IMPRESSION: No acute osseous abnormality. Electronically Signed   By: Annia Beltrew  Davis M.D.   On: 01/06/2018 20:32    Procedures Procedures (including critical care time)  Medications Ordered in ED Medications  carbidopa-levodopa (SINEMET CR) 50-200 MG per tablet controlled release 1 tablet (has no administration in time range)  carbidopa-levodopa (SINEMET IR) 25-250 MG per tablet immediate release 2 tablet (has no administration in time range)     Initial Impression / Assessment and Plan / ED Course  I have reviewed the triage vital signs and the nursing notes.  Pertinent labs & imaging results that were available during my care of the patient were reviewed by me and considered in my medical decision making (see chart for details).    Patient returns after discharge from the ED because of poor home situation.  Apparently his son is out of town and patient's bed is broken.  He has no new medical complaints.  Recent labs and imaging  reviewed.  Patient is stable to await social work and Sports coachcase manager in the morning.  Holding orders placed.  Final Clinical Impressions(s) / ED Diagnoses   Final diagnoses:  None    ED Discharge Orders    None       Chavela Justiniano, Jeannett SeniorStephen, MD 01/07/18 [phone]

## 2018-01-07 NOTE — ED Notes (Signed)
Kenzie social worker,called and stated she  Had reached his son Rulon EisenmengerFelix [phone] and he will pick patient up at 5 pm today.

## 2018-01-11 LAB — CULTURE, BLOOD (ROUTINE X 2)
Culture: NO GROWTH
Culture: NO GROWTH
SPECIAL REQUESTS: ADEQUATE
SPECIAL REQUESTS: ADEQUATE

## 2018-01-29 MED FILL — CARBIDOPA-LEVO ER 50-200 TA: 50-200 | 30 days supply | Qty: 30 | Fill #4

## 2018-01-29 MED FILL — CARBIDOPA-LEVODOPA 25-250 T: 25-250 | 30 days supply | Qty: 270 | Fill #4

## 2018-02-25 MED FILL — CARBIDOPA-LEVODOPA 25-250 T: 25-250 | 30 days supply | Qty: 270 | Fill #5

## 2018-02-25 MED FILL — CARBIDOPA-LEVO ER 50-200 TA: 50-200 | 30 days supply | Qty: 30 | Fill #5

## 2018-03-04 ENCOUNTER — Encounter: Payer: Self-pay | Admitting: Neurology

## 2018-03-04 ENCOUNTER — Ambulatory Visit (INDEPENDENT_AMBULATORY_CARE_PROVIDER_SITE_OTHER): Payer: Self-pay | Admitting: Neurology

## 2018-03-04 VITALS — BP 128/67 | HR 72

## 2018-03-04 DIAGNOSIS — G20 Parkinson's disease: Secondary | ICD-10-CM

## 2018-03-04 MED ORDER — CARBIDOPA-LEVODOPA 25-250 MG PO TABS: ORAL_TABLET | ORAL | 6 refills | Status: AC

## 2018-03-04 MED ORDER — CARBIDOPA-LEVODOPA ER 50-200 MG PO TBCR: 1.0000 | EXTENDED_RELEASE_TABLET | Freq: Every day | ORAL | 11 refills | Status: AC

## 2018-03-04 MED ORDER — CARBIDOPA-LEVODOPA ER 50-200 MG PO TBCR: 1.0000 | EXTENDED_RELEASE_TABLET | Freq: Every day | ORAL | 6 refills | Status: DC

## 2018-03-04 NOTE — Patient Instructions (Signed)
Increase immediate release sinemet 25/200 to five times a day at  6am, 9am, 12, 3pm, 6pm, every 3 hours,   Continue Sinemet CR 50/200 one tab at night.  Home physical therapy  Return to clinic in 3 months

## 2018-03-04 NOTE — Progress Notes (Signed)
PATIENT: Brandon Carrillo DOB: 03-Apr-1946  Chief Complaint  Patient presents with  . Parkinson's Disease    He is here, with an interpreter from [facility], to have his Parkinson's Disease further evaluated.   Marland Kitchen PCP    Marcine Matar, MD     HISTORICAL  Brandon Carrillo is a 72 years old male, seen in request by his primary care physician Dr. Laural Benes, Gavin Pound for evaluation of Parkinson's disease, initial evaluation was on March 04, 2018.  He is native of [place], came in wheelchair by Demetrios Isaacs, he is accompanied by interpreter at today's clinical visit, I was able also to talk with his daughter-in-law.  I have reviewed and summarized the referring note from the referring physician.  He was diagnosed with Parkinson's disease more than 20 years ago, was seen by a physician in the past, he is a poor historian, lives with his son's family, per daughter-in-law, after he took his current medication he is able to ambulate, feed himself, but the benefit of medicine is short lasting.  He supposed to take Sinemet 25/200 mg 2 tablets at 6 AM, 10 AM, 2 PM, 6 PM, Sinemet CR 50/200 mg at 10 PM,  When I examined him on 10:40 AM, after 2 dose of Sinemet  25/200mg  2 tablets at 6 AM, 9:30 AM, which is confirmed by patient and his daughter-in-law, he was noted to have significant bradykinesia, rigidity, bilateral hand resting tremor, He apparently has suffered significant memory loss, he is not oriented to year, month, and his age,   REVIEW OF SYSTEMS: Full 14 system review of systems performed and notable only for insomnia, hallucinations As above All other review of systems were negative.  ALLERGIES: No Known Allergies  HOME MEDICATIONS: Current Outpatient Medications  Medication Sig Dispense Refill  . carbidopa-levodopa (SINEMET CR) 50-200 MG tablet Take 1 tablet by mouth at bedtime. 30 tablet 6  . carbidopa-levodopa (SINEMET IR) 25-250 MG tablet 2 tablets QID and 1 extra a day PRN  (Patient taking differently: Take 2 tablets by mouth See admin instructions. 2 tablets QID and 1 extra a day PRN 6a, 10a, 12p, and 6p) 270 tablet 6  . meloxicam (MOBIC) 7.5 MG tablet TAKE 1 TABLET BY MOUTH DAILY. (Patient taking differently: Take 7.5 mg by mouth daily. ) 30 tablet 3  . Multiple Vitamin (MULTIVITAMIN) tablet Take 1 tablet by mouth daily.    . polyethylene glycol (MIRALAX / GLYCOLAX) packet Take 17 g by mouth daily. 100 each 6   No current facility-administered medications for this visit.     PAST MEDICAL HISTORY: Past Medical History:  Diagnosis Date  . Depression   . Diabetes mellitus without complication (HCC)   . Hypercholesteremia   . Parkinson's disease (HCC)   . Parkinson's disease (HCC)     PAST SURGICAL HISTORY: Past Surgical History:  Procedure Laterality Date  . LEG SURGERY      FAMILY HISTORY: Family History  Problem Relation Age of Onset  . Diabetes Mother   . Depression Father     SOCIAL HISTORY: Social History   Socioeconomic History  . Marital status: Widowed    Spouse name: Not on file  . Number of children: 4  . Years of education: 1st grade  . Highest education level: Not on file  Occupational History  . Occupation: Retired  Engineer, production  . Financial resource strain: Not on file  . Food insecurity:    Worry: Not on file    Inability: Not on  file  . Transportation needs:    Medical: Not on file    Non-medical: Not on file  Tobacco Use  . Smoking status: Former Games developer  . Smokeless tobacco: Never Used  . Tobacco comment: as a teenager  Substance and Sexual Activity  . Alcohol use: No  . Drug use: No  . Sexual activity: Yes  Lifestyle  . Physical activity:    Days per week: Not on file    Minutes per session: Not on file  . Stress: Not on file  Relationships  . Social connections:    Talks on phone: Not on file    Gets together: Not on file    Attends religious service: Not on file    Active member of club or  organization: Not on file    Attends meetings of clubs or organizations: Not on file    Relationship status: Not on file  . Intimate partner violence:    Fear of current or ex partner: Not on file    Emotionally abused: Not on file    Physically abused: Not on file    Forced sexual activity: Not on file  Other Topics Concern  . Not on file  Social History Narrative   Lives with his son and daughter-in-law.   Right-handed.   No caffeine use.     PHYSICAL EXAM   Vitals:   03/04/18 1013  BP: 128/67  Pulse: 72    Not recorded      There is no height or weight on file to calculate BMI.  PHYSICAL EXAMNIATION:  Gen: NAD, conversant, well nourised, obese, well groomed                     Cardiovascular: Regular rate rhythm, no peripheral edema, warm, nontender. Eyes: Conjunctivae clear without exudates or hemorrhage Neck: Supple, no carotid bruits. Pulmonary: Clear to auscultation bilaterally   NEUROLOGICAL EXAM:  MENTAL STATUS: Sitting in wheelchair, history is through interpreter, following commands, he has moderate anterocollis, with right tilt and right turn CRANIAL NERVES: CN II: Visual fields are full to confrontation.  Pupils are round equal and briskly reactive to light. CN III, IV, VI: extraocular movement are normal. No ptosis. CN V: Facial sensation is intact to pinprick in all 3 divisions bilaterally. Corneal responses are intact.  CN VII: Face is symmetric with normal eye closure and smile. CN VIII: Hearing is normal to rubbing fingers CN IX, X: Palate elevates symmetrically. Phonation is normal. CN XI: Head turning and shoulder shrug are intact CN XII: Tongue is midline with normal movements and no atrophy.  MOTOR: Constant bilateral upper extremity resting tremor, recent upper extremity and lower extremity against gravity without difficulty, significant bradykinesia  REFLEXES: Reflexes are hypoactive and symmetric at the biceps, triceps, knees, and  ankles. Plantar responses are flexor.  SENSORY: Withdraw to pain  COORDINATION: Rapid alternating movements and fine finger movements are intact. There is no dysmetria on finger-to-nose and heel-knee-shin.    GAIT/STANCE: Fail to stand with multiple attempts to get up from seated position  DIAGNOSTIC DATA (LABS, IMAGING, TESTING) - I reviewed patient records, labs, notes, testing and imaging myself where available.   ASSESSMENT AND PLAN  Brandon Carrillo is a 72 y.o. male    Advanced Parkinson's disease Cognitive impairment  Increased Sinemet at 25/250 mg to 2 tablets every 3 hours at 6 AM, 9 a.m., 12, 3 PM, 6 PM,  Continue Sinemet CR 50/200 mg at bedtime  Home physical therapy  Return to clinic in 3 months   . Levert Feinstein, M.D. Ph.D.  [facility] [address] [place], [place] [address] Ph: [phone] Fax: [phone]  CC: Marcine Matar, MD

## 2018-03-13 ENCOUNTER — Emergency Department (HOSPITAL_COMMUNITY): Payer: Self-pay

## 2018-03-13 ENCOUNTER — Emergency Department (HOSPITAL_COMMUNITY)
Admission: EM | Admit: 2018-03-13 | Discharge: 2018-03-14 | Disposition: A | Discharge status: Home / Self Care | Payer: Self-pay | Attending: Emergency Medicine | Admitting: Emergency Medicine

## 2018-03-13 DIAGNOSIS — R4689 Other symptoms and signs involving appearance and behavior: Secondary | ICD-10-CM

## 2018-03-13 DIAGNOSIS — Z87891 Personal history of nicotine dependence: Secondary | ICD-10-CM | POA: Insufficient documentation

## 2018-03-13 DIAGNOSIS — Y92009 Unspecified place in unspecified non-institutional (private) residence as the place of occurrence of the external cause: Secondary | ICD-10-CM

## 2018-03-13 DIAGNOSIS — R1031 Right lower quadrant pain: Secondary | ICD-10-CM | POA: Insufficient documentation

## 2018-03-13 DIAGNOSIS — M545 Low back pain, unspecified: Secondary | ICD-10-CM

## 2018-03-13 DIAGNOSIS — E119 Type 2 diabetes mellitus without complications: Secondary | ICD-10-CM | POA: Insufficient documentation

## 2018-03-13 DIAGNOSIS — W19XXXA Unspecified fall, initial encounter: Secondary | ICD-10-CM

## 2018-03-13 DIAGNOSIS — F23 Brief psychotic disorder: Secondary | ICD-10-CM | POA: Insufficient documentation

## 2018-03-13 DIAGNOSIS — G20 Parkinson's disease: Secondary | ICD-10-CM | POA: Insufficient documentation

## 2018-03-13 LAB — CBC WITH DIFFERENTIAL/PLATELET
ABS IMMATURE GRANULOCYTES: 0.04 10*3/uL (ref 0.00–0.07)
Basophils Absolute: 0 10*3/uL (ref 0.0–0.1)
Basophils Relative: 0 %
EOS PCT: 1 %
Eosinophils Absolute: 0.1 10*3/uL (ref 0.0–0.5)
HCT: 38.3 % — ABNORMAL LOW (ref 39.0–52.0)
HEMOGLOBIN: 11.9 g/dL — AB (ref 13.0–17.0)
Immature Granulocytes: 1 %
LYMPHS PCT: 23 %
Lymphs Abs: 1.5 10*3/uL (ref 0.7–4.0)
MCH: 29.3 pg (ref 26.0–34.0)
MCHC: 31.1 g/dL (ref 30.0–36.0)
MCV: 94.3 fL (ref 80.0–100.0)
MONO ABS: 1.4 10*3/uL — AB (ref 0.1–1.0)
MONOS PCT: 22 %
NEUTROS ABS: 3.4 10*3/uL (ref 1.7–7.7)
Neutrophils Relative %: 53 %
PLATELETS: 325 10*3/uL (ref 150–400)
RBC: 4.06 MIL/uL — ABNORMAL LOW (ref 4.22–5.81)
RDW: 13.8 % (ref 11.5–15.5)
WBC: 6.4 10*3/uL (ref 4.0–10.5)
nRBC: 0 % (ref 0.0–0.2)

## 2018-03-13 LAB — URINALYSIS, ROUTINE W REFLEX MICROSCOPIC
BILIRUBIN URINE: NEGATIVE
Bacteria, UA: NONE SEEN
GLUCOSE, UA: NEGATIVE mg/dL
Ketones, ur: 5 mg/dL — AB
LEUKOCYTES UA: NEGATIVE
NITRITE: NEGATIVE
PH: 6 (ref 5.0–8.0)
Protein, ur: NEGATIVE mg/dL
Specific Gravity, Urine: 1.004 — ABNORMAL LOW (ref 1.005–1.030)

## 2018-03-13 LAB — COMPREHENSIVE METABOLIC PANEL
ALBUMIN: 2.5 g/dL — AB (ref 3.5–5.0)
ALT: 13 U/L (ref 0–44)
AST: 35 U/L (ref 15–41)
Alkaline Phosphatase: 54 U/L (ref 38–126)
Anion gap: 5 (ref 5–15)
BILIRUBIN TOTAL: 0.4 mg/dL (ref 0.3–1.2)
BUN: 6 mg/dL — AB (ref 8–23)
CHLORIDE: 105 mmol/L (ref 98–111)
CO2: 26 mmol/L (ref 22–32)
CREATININE: 0.54 mg/dL — AB (ref 0.61–1.24)
Calcium: 7.9 mg/dL — ABNORMAL LOW (ref 8.9–10.3)
GFR calc Af Amer: >60 mL/min (ref >60)
GFR calc non Af Amer: >60 mL/min (ref >60)
GLUCOSE: 142 mg/dL — AB (ref 70–99)
POTASSIUM: 3.5 mmol/L (ref 3.5–5.1)
Sodium: 136 mmol/L (ref 135–145)
TOTAL PROTEIN: 6.4 g/dL — AB (ref 6.5–8.1)

## 2018-03-13 LAB — CK: Total CK: 201 U/L (ref 49–397)

## 2018-03-13 LAB — I-STAT TROPONIN, ED: TROPONIN I, POC: 0 ng/mL (ref 0.00–0.08)

## 2018-03-13 MED ORDER — CARBIDOPA-LEVODOPA 25-250 MG PO TABS
2.0000 | ORAL_TABLET | Freq: Four times a day (QID) | ORAL | Status: DC
  Administered 2018-03-14 (×2): 2 via ORAL
  Filled 2018-03-13 (×5): qty 2

## 2018-03-13 MED ORDER — CARBIDOPA-LEVODOPA 25-250 MG PO TABS
2.0000 | ORAL_TABLET | Freq: Once | ORAL | Status: DC
  Filled 2018-03-13: qty 2

## 2018-03-13 MED ORDER — CARBIDOPA-LEVODOPA ER 50-200 MG PO TBCR
1.0000 | EXTENDED_RELEASE_TABLET | Freq: Every day | ORAL | Status: DC
  Administered 2018-03-13: 1 via ORAL
  Filled 2018-03-13 (×2): qty 1

## 2018-03-13 MED ORDER — SODIUM CHLORIDE 0.9 % IV BOLUS
1000.0000 mL | Freq: Once | INTRAVENOUS | Status: AC
  Administered 2018-03-13: 1000 mL via INTRAVENOUS

## 2018-03-13 MED ORDER — IOHEXOL 300 MG/ML  SOLN
100.0000 mL | Freq: Once | INTRAMUSCULAR | Status: AC | PRN
  Administered 2018-03-13: 100 mL via INTRAVENOUS

## 2018-03-13 MED ORDER — CARBIDOPA-LEVODOPA 25-250 MG PO TABS
2.0000 | ORAL_TABLET | Freq: Once | ORAL | Status: AC
  Administered 2018-03-13: 2 via ORAL
  Filled 2018-03-13: qty 2
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
## 2018-03-13 NOTE — ED Notes (Signed)
Pt changed and re-dress in new gown.
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
## 2018-03-13 NOTE — BH Assessment (Addendum)
Tele Assessment Note   Patient Name: Brandon Carrillo MRN: [id number] Referring Physician: Tegeler [facility] Location of Provider: Behavioral Health TTS Department  Brandon Carrillo is an 72 y.o. male.  The pt came in due medical reasons.  The pt fell in his trailer last Thursday and was found soiled.  The pt has Parkinson's and has hallucinations. The pt stated he has been having hallucinations for the past month. The pt reported he has been seeing and hearing dolls laughing at him.  The pt denies any previous hallucinations.  The pt denies any psych history.  He is currently not seeing a counselor or psychiatrist.    The pt lives by himself and would like to have someone live with him to help him.  The pt stated his children do not help him. APS is involved with the pt.  The pt denies SI, self harm, HI, and legal issues.  The pt stated he was sleeping well, but not now due to back pain.  The pt has a good appetite.  The pt denies SA.    Pt is dressed in a hospital gown. He is alert and oriented x4. Spoke with the pt via an interpreter Eye contact is good. Pt's mood is irritated. Thought process is coherent and relevant. There is no indication Pt is currently responding to internal stimuli or experiencing delusional thought content.?Pt was cooperative throughout assessment.       Diagnosis: F23 Brief psychotic disorder  Past Medical History:  Past Medical History:  Diagnosis Date  . Depression   . Diabetes mellitus without complication (HCC)   . Hypercholesteremia   . Parkinson's disease (HCC)   . Parkinson's disease [facility](HCC)     Past Surgical History:  Procedure Laterality Date  . LEG SURGERY      Family History:  Family History  Problem Relation Age of Onset  . Diabetes Mother   . Depression Father     Social History:  reports that he has quit smoking. He has never used smokeless tobacco. He reports that he does not drink alcohol or use  drugs.  Additional Social History:  Alcohol / Drug Use Pain Medications: See MAR Prescriptions: See MAR Over the Counter: See MAR History of alcohol / drug use?: No history of alcohol / drug abuse Longest period of sobriety (when/how long): NA  CIWA: CIWA-Ar BP: 134/64 Pulse Rate: 87 COWS:    Allergies: No Known Allergies  Home Medications:  (Not in a hospital admission)  OB/GYN Status:  No LMP for male patient.  General Assessment Data [facility] Assessment: In system Is this a Tele or Face-to-Face Assessment?: Face-to-Face Is this an Initial Assessment or a Re-assessment for this encounter?: Initial Assessment Patient Accompanied by:: N/A Language Other than English: No Living Arrangements: Other (Comment)(home) What gender do you identify as?: Male Marital status: Single Maiden name: NA Pregnancy Status: No Living Arrangements: Alone Can pt return to current living arrangement?: (uncertain) Admission Status: Voluntary Is patient capable of signing voluntary admission?: Yes Referral Source: Self/Family/Friend Insurance type: Self Pay     Crisis Care Plan Living Arrangements: Alone Legal Guardian: Other:(Self) Name of Psychiatrist: none Name of Therapist: none  Education Status Is patient currently in school?: No Is the patient employed, unemployed or receiving disability?: Unemployed  Risk to self with the past 6 months Suicidal Ideation: No Has patient been a risk to self within the past 6 months prior to admission? : No Suicidal Intent:  No Has patient had any suicidal intent within the past 6 months prior to admission? : No Is patient at risk for suicide?: No Suicidal Plan?: No Has patient had any suicidal plan within the past 6 months prior to admission? : No Access to Means: No What has been your use of drugs/alcohol within the last 12 months?: none Previous Attempts/Gestures: No How many times?: 0 Other Self Harm Risks:  none Triggers for Past Attempts: None known Intentional Self Injurious Behavior: None Family Suicide History: Unknown Recent stressful life event(s): Recent negative physical changes(fell last week) Persecutory voices/beliefs?: No Depression: No Substance abuse history and/or treatment for substance abuse?: No Suicide prevention information given to non-admitted patients: Not applicable  Risk to Others within the past 6 months Homicidal Ideation: No Does patient have any lifetime risk of violence toward others beyond the six months prior to admission? : No Thoughts of Harm to Others: No Current Homicidal Intent: No Current Homicidal Plan: No Access to Homicidal Means: No Identified Victim: none History of harm to others?: No Assessment of Violence: None Noted Violent Behavior Description: none Does patient have access to weapons?: No Criminal Charges Pending?: No Does patient have a court date: No Is patient on probation?: No  Psychosis Hallucinations: Auditory, Visual Delusions: None noted  Mental Status Report Appearance/Hygiene: Body odor, Disheveled Eye Contact: Fair Motor Activity: Tremors Speech: Logical/coherent, Unable to assess(spoke with the use of an interpreter) Level of Consciousness: Alert, Restless Mood: Pleasant Affect: Appropriate to circumstance Anxiety Level: Minimal Thought Processes: Coherent, Relevant Judgement: Unimpaired Orientation: Person, Place, Time, Situation Obsessive Compulsive Thoughts/Behaviors: None  Cognitive Functioning Concentration: Normal Memory: Recent Intact, Remote Intact Is patient IDD: No Insight: Fair Impulse Control: Good Appetite: Good Have you had any weight changes? : No Change Sleep: Decreased(due to back pain) Total Hours of Sleep: 6 Vegetative Symptoms: None  ADLScreening [facility] Assessment Services) Patient's cognitive ability adequate to safely complete daily activities?: Yes Patient able to express need for  assistance with ADLs?: Yes Independently performs ADLs?: Yes (appropriate for developmental age)  Prior Inpatient Therapy Prior Inpatient Therapy: No  Prior Outpatient Therapy Prior Outpatient Therapy: No Does patient have an ACCT team?: No Does patient have Intensive In-House Services?  : No Does patient have [facility] services? : No Does patient have P4CC services?: No  ADL Screening (condition at time of admission) Patient's cognitive ability adequate to safely complete daily activities?: Yes Patient able to express need for assistance with ADLs?: Yes Independently performs ADLs?: Yes (appropriate for developmental age)                        Disposition:  Disposition Initial Assessment Completed for this Encounter: Yes   MD Lucianne Muss recommends the pt be psych cleared.  PA Hina was made aware.  This service was provided via telemedicine using a 2-way, interactive audio and video technology.  Names of all persons participating in this telemedicine service and their role in this encounter. Name: Galan Ghee Role: Pt  Name: Riley Churches Role: TTS  Name:  Role:   Name:  Role:     Ottis Stain 03/13/2018 4:21 PM

## 2018-03-13 NOTE — Progress Notes (Addendum)
10:20 PM Pt cleared per psych. CSW reached out to pt's son, Rulon EisenmengerFelix [phone]. Rulon EisenmengerFelix refused to allow pt to return. CSW asked Rulon EisenmengerFelix if any other family members would be able to have pt live with them. Rulon EisenmengerFelix does not know of any family members that he could stay with.   CSW called pt's daughter Donata ClayKarina, [phone]. CSW left HIPAA complaint voicemail.   CSW updated Social Work Merchandiser, retailupervisor, [employer] on situation.  9:15 PM: CSW and RNCM spoke to pt at pt's bedside. Explained to pt via interpretter that pt cannot return to pt's son's property. Pt understanding and stated no hard feelings, and he will figure out what he needs to do day to day. Pt said he would like to go to be able to retrieve a few belongings. Asked questions about his ability to take care of himself at home and said he is unable to go home because of not being able to properly care for himself. Pt did not answer the question about being able to care for self.   6:00 PM Pt's son and daughter in law arrived to ED. RNCM and CSW spoke with pt's son, Rulon EisenmengerFelix and daughter in Social workerlaw. Pt lives on their property. Pt's son stated that pt cannot live in their home due to safety concerns from pt. Pt has been living here for about 20 years. Pt started living with them after wife died suddenly (from being stabbed).  Pt's son reports that pt stopped wanting to do anything a couple of days ago and he normally uses the restroom on his own. Pt's son reports that pt can walk, but frequently chooses not to. Pt's daughter in law gives him food for every meal. Pt's son does not think it is safe for daughter in law to enter pt's trailer, so she leaves the food for him everyday. Pt's son sends pt to his doctor's appointments via uber because of fear of the pt. Pt's son reports that pt has a large knife besides his bed. Pt's son is fearful of pt's potential to be violent. CSW explained to family that pt has been deemed to be psychiatrically cleared. Pt's son does not  want pt returning to his property at this point because he believes pt to be violent and a danger.   Pt's son has tried to apply for Medicaid and Medicare for pt. Pt does not have a social security number, therefore does not qualify. APS is actively involved. APS became involved today, after pt ran to his neighbor's house.   CSW updated Systems developersocial worker supervisor about barriers to placement. Supervisor aware of pt's situation and family's refusal for pt to return to their property.  4:45 PMCSW spoke with pt's APS worker [place], [phone]. APS worker received the case today and went out to the home and spoke with pt and family. CSW explained that pt is psychiatrically cleared and medical labs so far have been unremarkable. APS is continuing to follow pt. APS was out to the home to speak with pt and family today.  Per EMS and hospital staff, pt arrived to hospital covered in feces. Per EMS, living conditions appeared unhealthy.   CSW and RNCM spoke with son, Rulon EisenmengerFelix. Son is on his way to the ED to speak with staff and to explain situation.   Montine CircleKelsy Lorijean Husser, Silverio LayLCSWA [facility]  [phone]

## 2018-03-13 NOTE — ED Provider Notes (Signed)
[facility] EMERGENCY DEPARTMENT Provider Note   CSN: [id number] Arrival date & time: 03/13/18  1302     History   Chief Complaint Chief Complaint  Patient presents with  . Fall    HPI Brandon Carrillo is a 72 y.o. male with a past medical history of Parkinson's disease, who presents to ED for fall that may have had been last week.  Per triage note, patient lives alone in a trailer on his family property.  He was found by his social worker to be covered in feces with dirty living conditions.  He states that he fell last week and laid on the floor for 1 day and 1 night.  States that his son found him after breaking the door.  He complains of intermittent chest pain for the past several weeks, bilateral hip pain and lower right sided back pain as well.  He does endorse several episodes of diarrhea earlier in the week as well as 1 day where he had nonbloody, nonbilious emesis.  He reports compliance with his home Parkinson's medications.  He does not take any other daily medications.  He states that he has been walking with pain since the fall.  He does endorse intermittent headache and states that he did lose consciousness when he fell last week.  Denies any anticoagulant use, numbness in arms or legs, changes to memory, fever.  HPI  Past Medical History:  Diagnosis Date  . Depression   . Diabetes mellitus without complication (HCC)   . Hypercholesteremia   . Parkinson's disease (HCC)   . Parkinson's disease [facility])     Patient Active Problem List   Diagnosis Date Noted  . Generalized weakness 09/04/2016  . Onychomycosis of toenail 09/17/2015  . Constipation 09/17/2015  . Bilateral knee pain 09/17/2015  . Other fatigue 09/17/2015  . Vitamin D deficiency 09/17/2015  . Pain, dental 09/16/2015  . Diabetes type 2, controlled (HCC) 03/02/2015  . Fall at home 03/02/2015  . At high risk for falls 03/02/2015  . NSTEMI (non-ST elevated myocardial infarction) (HCC)  08/30/2014  . Parkinson's disease (HCC) 10/21/2013  . Depression 10/21/2013  . Midline thoracic back pain 10/21/2013    Past Surgical History:  Procedure Laterality Date  . LEG SURGERY          Home Medications    Prior to Admission medications   Medication Sig Start Date End Date Taking? Authorizing Provider  bismuth subsalicylate (PEPTO BISMOL) 262 MG/15ML suspension Take 30 mLs by mouth every 6 (six) hours as needed for diarrhea or loose stools.   Yes [provider]  carbidopa-levodopa (SINEMET CR) 50-200 MG tablet Take 1 tablet by mouth at bedtime. 03/04/18  Yes Levert Feinstein, MD  carbidopa-levodopa (SINEMET IR) 25-250 MG tablet 2 tablets five times a day at 6am, 9am, 12, 15, 18pm Patient taking differently: Take 2 tablets by mouth See admin instructions. Take 2 tablets by mouth two times a day- 10 AM and 6 PM 03/04/18  Yes Levert Feinstein, MD  meloxicam (MOBIC) 7.5 MG tablet TAKE 1 TABLET BY MOUTH DAILY. Patient not taking: Reported on 03/13/2018 01/31/17   Marcine Matar, MD  Multiple Vitamin (MULTIVITAMIN) tablet Take 1 tablet by mouth daily.    [provider]  polyethylene glycol (MIRALAX / GLYCOLAX) packet Take 17 g by mouth daily. Patient not taking: Reported on 03/13/2018 03/22/17   Marcine Matar, MD    Family History Family History  Problem Relation Age of Onset  .  Diabetes Mother   . Depression Father     Social History Social History   Tobacco Use  . Smoking status: Former Games developer  . Smokeless tobacco: Never Used  . Tobacco comment: as a teenager  Substance Use Topics  . Alcohol use: No  . Drug use: No     Allergies   Patient has no known allergies.   Review of Systems Review of Systems  Constitutional: Negative for appetite change, chills and fever.  HENT: Negative for ear pain, rhinorrhea, sneezing and sore throat.   Eyes: Negative for photophobia and visual disturbance.  Respiratory: Negative for cough, chest tightness,  shortness of breath and wheezing.   Cardiovascular: Positive for chest pain. Negative for palpitations.  Gastrointestinal: Positive for abdominal pain, diarrhea, nausea and vomiting. Negative for blood in stool and constipation.  Genitourinary: Negative for dysuria, hematuria and urgency.  Musculoskeletal: Positive for arthralgias and myalgias.  Skin: Negative for rash.  Neurological: Positive for headaches. Negative for dizziness, weakness and light-headedness.     Physical Exam Updated Vital Signs BP 134/64   Pulse 87   Temp 98 F (36.7 C) (Oral)   Resp 16   SpO2 100%   Physical Exam  Constitutional: He appears well-developed and well-nourished. No distress.  HENT:  Head: Normocephalic and atraumatic.  Nose: Nose normal.  Eyes: Pupils are equal, round, and reactive to light. Conjunctivae and EOM are normal. Right eye exhibits no discharge. Left eye exhibits no discharge. No scleral icterus.  Neck: Normal range of motion. Neck supple.  Cardiovascular: Normal rate, regular rhythm, normal heart sounds and intact distal pulses. Exam reveals no gallop and no friction rub.  No murmur heard. Pulmonary/Chest: Effort normal and breath sounds normal. No respiratory distress.  Abdominal: Soft. Bowel sounds are normal. He exhibits no distension. There is no tenderness. There is no guarding.  Musculoskeletal: Normal range of motion. He exhibits no edema.  Ecchymosis noted on left lower back.  Tenderness to palpation diffusely of the C, T and L-spine.  No visible bruising, edema or temperature change noted. No objective signs of numbness present. No saddle anesthesia. 2+ DP pulses bilaterally. Sensation intact to light touch. Strength 5/5 in bilateral lower extremities.  Neurological: He is alert. No cranial nerve deficit. He exhibits normal muscle tone. Coordination normal.  Alert and oriented x4.  Equal grip strength bilaterally.  No facial asymmetry noted.  Skin: Skin is warm and dry. No  rash noted.  Psychiatric: He has a normal mood and affect.  Nursing note and vitals reviewed.    ED Treatments / Results  Labs (all labs ordered are listed, but only abnormal results are displayed) Labs Reviewed  COMPREHENSIVE METABOLIC PANEL - Abnormal; Notable for the following components:      Result Value   Glucose, Bld 142 (*)    BUN 6 (*)    Creatinine, Ser 0.54 (*)    Calcium 7.9 (*)    Total Protein 6.4 (*)    Albumin 2.5 (*)    All other components within normal limits  CBC WITH DIFFERENTIAL/PLATELET - Abnormal; Notable for the following components:   RBC 4.06 (*)    Hemoglobin 11.9 (*)    HCT 38.3 (*)    Monocytes Absolute 1.4 (*)    All other components within normal limits  URINALYSIS, ROUTINE W REFLEX MICROSCOPIC - Abnormal; Notable for the following components:   Specific Gravity, Urine 1.004 (*)    Hgb urine dipstick SMALL (*)    Ketones, ur 5 (*)  All other components within normal limits  URINE CULTURE  CK  I-STAT TROPONIN, ED    EKG EKG Interpretation  Date/Time:  Wednesday March 13 2018 14:25:09 EST Ventricular Rate:  71 PR Interval:    QRS Duration: 108 QT Interval:  410 QTC Calculation: 446 R Axis:   55 Text Interpretation:  Sinus rhythm Atrial premature complex ST elevation, consider anterior injury When compared to prior,  improved artifact.  New PAC.  no STEMI Confirmed by Theda Belfast ([id number]) on 03/13/2018 2:32:11 PM   Radiology Dg Chest 2 View  Result Date: 03/13/2018 CLINICAL DATA:  Chest pain EXAM: CHEST - 2 VIEW COMPARISON:  01/06/2018 FINDINGS: Artifact from EKG leads. Low volume chest. There is no edema, consolidation, effusion, or pneumothorax. Normal heart size and mediastinal contours. IMPRESSION: No evidence of active disease. Electronically Signed   By: Marnee Spring M.D.   On: 03/13/2018 15:19   Ct Head Wo Contrast  Result Date: 03/13/2018 CLINICAL DATA:  Patient found on bed covered in feces and badly soiled.  Patient fell earlier this week. Headache. EXAM: CT HEAD WITHOUT CONTRAST CT CERVICAL SPINE WITHOUT CONTRAST TECHNIQUE: Multidetector CT imaging of the head and cervical spine was performed following the standard protocol without intravenous contrast. Multiplanar CT image reconstructions of the cervical spine were also generated. COMPARISON:  01/06/2018 FINDINGS: CT HEAD FINDINGS Brain: Stable age related involutional changes of the brain. Chronic small vessel ischemic disease of periventricular subcortical white matter. No evidence of acute large vascular territory infarct, hemorrhage, hydrocephalus, extra-axial fluid collection mass or mass effect. Midline fourth ventricle basal cisterns without effacement. Brainstem and cerebellum are nonacute. Vascular: Moderate atherosclerosis of the carotid siphons bilaterally. No hyperdense vessel sign. Skull: Intact Sinuses/Orbits: Minimal anterior ethmoid sinus mucosal thickening right. Intact orbits and globes. Other: Clear bilateral mastoids. CT CERVICAL SPINE FINDINGS Alignment: Slight reversal cervical lordosis with apex at C5-6 attributable to degenerative disc disease. Skull base and vertebrae: Intact Soft tissues and spinal canal: No prevertebral fluid or swelling. No visible canal hematoma. Disc levels: Slight disc space narrowing at C5-6 and moderate at C6-7. Small anterior posterior osteophytes are noted at C6-7 with uncovertebral joint osteoarthritis and minimal foraminal encroachment bilaterally due to uncinate spurring at this level. Facet joints are maintained. Upper chest: Negative. Other: None IMPRESSION: 1. Chronic small vessel ischemic disease of periventricular subcortical white matter. No acute intracranial abnormality. 2. No acute posttraumatic cervical spine fracture or subluxation. Electronically Signed   By: Tollie Eth M.D.   On: 03/13/2018 15:09   Ct Cervical Spine Wo Contrast  Result Date: 03/13/2018 CLINICAL DATA:  Patient found on bed  covered in feces and badly soiled. Patient fell earlier this week. Headache. EXAM: CT HEAD WITHOUT CONTRAST CT CERVICAL SPINE WITHOUT CONTRAST TECHNIQUE: Multidetector CT imaging of the head and cervical spine was performed following the standard protocol without intravenous contrast. Multiplanar CT image reconstructions of the cervical spine were also generated. COMPARISON:  01/06/2018 FINDINGS: CT HEAD FINDINGS Brain: Stable age related involutional changes of the brain. Chronic small vessel ischemic disease of periventricular subcortical white matter. No evidence of acute large vascular territory infarct, hemorrhage, hydrocephalus, extra-axial fluid collection mass or mass effect. Midline fourth ventricle basal cisterns without effacement. Brainstem and cerebellum are nonacute. Vascular: Moderate atherosclerosis of the carotid siphons bilaterally. No hyperdense vessel sign. Skull: Intact Sinuses/Orbits: Minimal anterior ethmoid sinus mucosal thickening right. Intact orbits and globes. Other: Clear bilateral mastoids. CT CERVICAL SPINE FINDINGS Alignment: Slight reversal cervical lordosis with apex at C5-6  attributable to degenerative disc disease. Skull base and vertebrae: Intact Soft tissues and spinal canal: No prevertebral fluid or swelling. No visible canal hematoma. Disc levels: Slight disc space narrowing at C5-6 and moderate at C6-7. Small anterior posterior osteophytes are noted at C6-7 with uncovertebral joint osteoarthritis and minimal foraminal encroachment bilaterally due to uncinate spurring at this level. Facet joints are maintained. Upper chest: Negative. Other: None IMPRESSION: 1. Chronic small vessel ischemic disease of periventricular subcortical white matter. No acute intracranial abnormality. 2. No acute posttraumatic cervical spine fracture or subluxation. Electronically Signed   By: Tollie Ethavid  Kwon M.D.   On: 03/13/2018 15:09   Ct Abdomen Pelvis W Contrast  Result Date: 03/13/2018 CLINICAL  DATA:  Pt was found on bed covered in feces, home was found to be filthy and badly soiled. Pt is spanish speaking. EMS was told pt fell earlier this week but was not given a specific time nor injury site(s). Bruising noted to pt's poste.*comment was truncated*^18500mL OMNIPAQUE IOHEXOL 300 MG/ML SOLNAbd pain, acute, generalized EXAM: CT ABDOMEN AND PELVIS WITH CONTRAST TECHNIQUE: Multidetector CT imaging of the abdomen and pelvis was performed using the standard protocol following bolus administration of intravenous contrast. CONTRAST:  100mL OMNIPAQUE IOHEXOL 300 MG/ML  SOLN COMPARISON:  CT 05/29/2016 FINDINGS: Lower chest: Lung bases are clear. Hepatobiliary: No focal hepatic lesion. No biliary duct dilatation. Gallbladder is normal. Common bile duct is normal. Pancreas: Pancreas is normal. No ductal dilatation. No pancreatic inflammation. Spleen: Normal spleen Adrenals/urinary tract: No change in size of 2 cm LEFT adrenal adenoma. RIGHT gland normal. No hydronephrosis. RIGHT cysts. Ureters normal. Stomach/Bowel: Stomach, small-bowel and cecum normal. Appendix not identified. Ascending transverse and descending grossly normal. No dilated large or small bowel. Vascular/Lymphatic: Abdominal aorta is normal caliber with atherosclerotic calcification. There is no retroperitoneal or periportal lymphadenopathy. No pelvic lymphadenopathy. Reproductive: Prostate normal Other: No free fluid or free air. Musculoskeletal: No aggressive osseous lesion. IMPRESSION: 1. No bowel obstruction. 2. No evidence of abdominopelvic infection or  acute inflammation. 3. No evidence trauma. Electronically Signed   By: Genevive BiStewart  Edmunds M.D.   On: 03/13/2018 15:18   Ct L-spine No Charge  Result Date: 03/13/2018 CLINICAL DATA:  Larey SeatFell.  Pain and limited mobility. EXAM: CT LUMBAR SPINE WITHOUT CONTRAST TECHNIQUE: Multidetector CT imaging of the lumbar spine was performed without intravenous contrast administration. Multiplanar CT image  reconstructions were also generated. COMPARISON:  None. FINDINGS: Segmentation: Assuming there are 12 ribs, L5 is transitional. Alignment: Normal Vertebrae: No evidence of regional fracture. Paraspinal and other soft tissues: Negative. See results of abdominal CT. Disc levels: No significant finding at L2-3 or above. L3-4: Mild disc bulge.  No compressive stenosis. L4-5: Mild to moderate disc bulge. Mild facet degeneration. Mild lateral recess and foraminal narrowing. L5-S1: Transitional and unremarkable. Bilateral sacroiliac osteoarthritis. IMPRESSION: No acute or traumatic finding. L5 is a transitional vertebra. Mild lower lumbar degenerative changes with mild lateral recess and foraminal narrowing at L4-5. Electronically Signed   By: Paulina FusiMark  Shogry M.D.   On: 03/13/2018 15:18    Procedures Procedures (including critical care time)  Medications Ordered in ED Medications  carbidopa-levodopa (SINEMET IR) 25-250 MG per tablet immediate release 2 tablet (has no administration in time range)  carbidopa-levodopa (SINEMET IR) 25-250 MG per tablet immediate release 2 tablet (has no administration in time range)  carbidopa-levodopa (SINEMET CR) 50-200 MG per tablet controlled release 1 tablet (has no administration in time range)  sodium chloride 0.9 % bolus 1,000 mL (0 mLs  Intravenous Stopped 03/13/18 1609)  iohexol (OMNIPAQUE) 300 MG/ML solution 100 mL (100 mLs Intravenous Contrast Given 03/13/18 1504)  carbidopa-levodopa (SINEMET IR) 25-250 MG per tablet immediate release 2 tablet (2 tablets Oral Given 03/13/18 1801)     Initial Impression / Assessment and Plan / ED Course  I have reviewed the triage vital signs and the nursing notes.  Pertinent labs & imaging results that were available during my care of the patient were reviewed by me and considered in my medical decision making (see chart for details).  Clinical Course as of Mar 13 2102  Wed Mar 13, 2018  1635 TTS states that patient is  psychiatrically cleared.   [HK]    Clinical Course User Index [HK] Dietrich Pates, PA-C   72 year old male with past medical history of Parkinson's disease presents to ED for fall that may have occurred last week.  Per EMS, patient lives alone in a camper on family property.  He states that he fell about 6 days ago and laid on the floor for about 1 day.  His son found him but did not bring him to the ED.  He is also complained of intermittent chest pain, headache, vomiting and diarrhea all within the past week.  He has ambulated with an abnormal gait due to pain since then.  He complains of right-sided lower back pain, bilateral hip pain, and neck pain.  On exam there is ecchymosis noted on the lower back.  There is diffuse tenderness palpation of the C, T and L-spine.  Patient is alert and oriented x4.  No visual asymmetry noted.  He denies any anticoagulant use.  Plan is to obtain lab work, imaging and reassess.  Lab work reassuring.  CT is done are unremarkable for any acute abnormalities. Throughout his ED course, I consulted social work.  They state that APS is involved and would like a psych consult on him.  Initial psych consult did clear him however, son at bedside states that patient has had homicidal tendencies towards his family members in the past and recently.  We will reconsult TTS for [facility].  Case management and TTS are both involved in this patient's care.  He will need to be evaluated by a psychiatrist in the a.m.  In the meantime will be placed in psych hold and home medications reordered. Please see both TTS and social work note for further detail. Patient discussed with and seen by my attending, Dr. Rush Landmark.    Portions of this note were generated with Scientist, clinical (histocompatibility and immunogenetics). Dictation errors may occur despite best attempts at proofreading.  Final Clinical Impressions(s) / ED Diagnoses   Final diagnoses:  Lower back pain    ED Discharge Orders    None         Dietrich Pates, PA-C 03/13/18 2104    Margarita Grizzle, MD 03/13/18 2318

## 2018-03-13 NOTE — ED Triage Notes (Signed)
Pt arrived via [facility] from home where pt lives alone in a trailer on family property. EMS was notified by Health and human services after a welfare check was done. Pt was found on bed covered in feces, home was found to be filthy and badly soiled. Pt is spanish speaking. EMS was told pt fell earlier this week but was not given a specific time nor injury site(s). Bruising noted to pt's posterior right lower flank. EDP at bedside for intake. VSS. Pt alert and oriented.

## 2018-03-14 LAB — URINE CULTURE

## 2018-03-14 LAB — CBG MONITORING, ED: Glucose-Capillary: 112 mg/dL — ABNORMAL HIGH (ref 70–99)

## 2018-03-14 NOTE — Progress Notes (Signed)
CSW received call back from pt's APS worker Brandon Carrillo. [name] stated if hospital needs to send him to a shelter, they will follow up with him there. APS [facility]. Per APS , services were stopped a year ago with [facility] due to inappropriateness by pt and family's changing minds about wanting help.   CSW spoke to pt's son Brandon Carrillo. Brandon Carrillo okay with pt coming back to the house if police meet pt at the house and ensure that pt does not stay.   CSW spoke with pt via Nurse, learning disabilitytranslator. Pt asked CSW to call pt's son Brandon Carrillo. Pt spoke with Brandon Carrillo. CSW then spoke with Brandon Carrillo. Brandon Carrillo is going to get pt a hotel [place]. Pt needs to first go [name]'s house to gather his belongings. Brandon Carrillo does not feel comfortable with pt coming without police. CSW called to update Brandon Carrillo. Brandon Carrillo understanding of plan and agreeable. From [name]'s house, pt will be transported via taxi to [employer]. Brandon Carrillo will have a ride take pt to hotel.   CSW called non emergency police requesting police officer be at the [name]'s home to meet pt. [employer] to call CSW when police dispatched to the home then CSW will call for pt's taxi.   CSW updated EDP and RN.   Brandon Carrillo, Brandon Carrillo  [phone]

## 2018-03-14 NOTE — Progress Notes (Signed)
Disposition CSW reviewed patient's chart as a new consult had been ordered and it was unclear why as patient did not meet criteria yesterday.  CS[facility], who noted that EDP has decided the consult is not required as information about patient being violent was incorrect.  CSW will close out consult.  CSW notified TTS therapist, Ace GinsKendall G, LPC.  Timmothy EulerJean T. Kaylyn LimSutter, MSW, LCSWA Disposition Clinical Social Work [phone] (cell) [phone] (office)

## 2018-03-14 NOTE — ED Notes (Signed)
Social work bedside.

## 2018-03-14 NOTE — ED Notes (Signed)
Patient given lunch meal tray

## 2018-03-14 NOTE — Progress Notes (Addendum)
2:17pm-CSW spoke with Brandon Carrillo to express pt's desire of getting clothed. CSW presented the idea of having pt escorted by a police to come and get belongings. Brandon Carrillo to call back.   2:09pm-CSW left another voicemail for pt's APS worker Brandon Carrillo at this time to gather more information on what they are able to do to assist pt. CSW did speak with son Brandon Carrillo earlier and was again informed that pt is not able to return to the home. CSW asked that since pt is no longer able to come back home could son or someone bring pt's belongings to the ED as pt may be sent to a shelter, Brandon Carrillo expressed "no im not coming up there, its not a good idea". CSW understanding and will follow up as needed.  1:56pm-[name] and was informed that if staff is wanting pt to be reassessed by psych, EDP would [facility]. CSW provided number to EDP for further discussion of reassessment of psych by Brandon Carrillo.   1:51pm-[facility] provided. Unable to leave voicemail. CSW still  Following up with MD regarding further needs in pt's care.   11:17am-CSW spoke with Brandon Carrillo APS worker and was informed that she has not established anything new for pt at this time. Brandon was informed by CSW that CSW has reached out to pt's son and daughter with son still expressing that pt can not come back home and daughter not answering the phone at this time. CSW spoke with pt at bedside and pt expressed that pt is wanting to go back home as pt "always lives like this in the camper". Pt expressed that pt only wants to get clothes and money if not allowed back at the home. CSW spoke once more with Brandon Carrillo and Brandon Carrillo expresses that he is not coming to get pt. CSW offered alternative transportation to get pt home and Brandon Carrillo declined. CSW advised Brandon Carrillo and APS worker that if no one is willing to pick pt up pt may be given resources for shelters.   Brandon expressed that she is reaching out to her  supervisor and has already reached out to [employer] [facility] [place] [phone]. At this time Brandon awaits call from not parties.   9:41am-CSW spoke with pt's son Brandon Carrillo and was informed that he reached out to brother Brandon Carrillo regarding pt and has not gotten a response from him at this time. CSW still awaits call from APS worker Brandon Carrillo at this time. CSW reached back out pt's daughter listed in chart. Gentleman answered phone and then hung up.   CSW went to speak with pt at bedside to determine further wishes as pt is said to be alert and oriented- pt getting care at this time. CSW followed up with pt's APS worker [name]. CSW left voicemail for her for further updates on placement plans for pt at this time. CSW awaits call back from Brandon at this time as previous notes mention that pt's family is unwilling to come and get pt at this time.    Brandon Carrillo, MSW, LCSW-A Emergency Department Clinical Social Worker [phone]

## 2018-03-14 NOTE — ED Notes (Signed)
Patient given water to drink.  

## 2018-03-14 NOTE — ED Provider Notes (Signed)
Social work saw the patient and a plan was made for him to go to his son [name]'s house with a police escort to get his belongings.  He will then travel to his son [name]'s house and they will put him in a hotel to make sure he is safe and then they plan on sending him back to [place] to live with family.   Gwyneth SproutPlunkett, Evagelia Knack, MD 03/14/18 [phone]

## 2018-03-14 NOTE — ED Notes (Signed)
Pt given breakfast tray

## 2018-03-14 NOTE — ED Notes (Signed)
Patient requesting Sinemet IR medication. This RN explained to the patient that messages have been sent to pharmacy and that the medication will be arriving shortly. Patient verbalizes understanding.

## 2018-03-14 NOTE — Progress Notes (Signed)
CSW followed up with [employer]. Police officer has not been assigned to dispatch to pt's son home. Dispatch will call CSW when police officer has been dispatched.   Montine CircleKelsy Vance Belcourt, Silverio LayLCSWA [facility]  [phone]

## 2018-03-14 NOTE — ED Notes (Signed)
Pt denies SI/HI upon discharge from emergency department and verbalizes understanding of plan.

## 2018-03-14 NOTE — ED Provider Notes (Signed)
Brandon Carrillo is a 72 y.o. male awaiting disposition planning per SW.  Patient evaluated. Only complaint is being hungry and needing his medication this morning. Breakfast has been ordered and meds on the way from pharmacy.   Gen: afebrile, VSS HEENT: Atraumatic, EOMI Resp: no resp distress CV: RRR Abd: soft, NT, ND MsK: moving all extremities well Neuro: A&O x4; Tremors present   Ward, Chase PicketJaime Pilcher, PA-C 03/14/18 [id number]    Doug SouJacubowitz, Sam, MD 03/14/18 [id number]

## 2018-03-14 NOTE — ED Notes (Signed)
Patient verbalizes understanding of discharge instructions. Opportunity for questioning and answers were provided. Armband removed by staff, pt discharged from ED.  

## 2018-03-14 NOTE — ED Notes (Signed)
Patient tearful while using translator to communicate. Pt asking "Can I call Nain? Can I go home to my family? Why won't they come and get me?" This RN explained to the patient that we are working with social work to be able to help him get to where he needs to be. Pt verbalized understanding.

## 2018-06-24 ENCOUNTER — Ambulatory Visit: Payer: Self-pay | Admitting: Neurology

## 2019-06-09 ENCOUNTER — Ambulatory Visit: Payer: Self-pay | Attending: Internal Medicine

## 2019-08-09 IMAGING — CT CT CERVICAL SPINE W/O CM
3 of 4 series · 12 of 33 positions shown, 14 images · non-contrast
Comparison: 01/06/2018

CLINICAL DATA: Patient found on bed covered in feces and badly
soiled. Patient fell earlier this week. Headache.

EXAM:
CT HEAD WITHOUT CONTRAST
CT CERVICAL SPINE WITHOUT CONTRAST
TECHNIQUE: Multidetector CT imaging of the head and cervical spine was
performed following the standard protocol without intravenous
contrast. Multiplanar CT image reconstructions of the cervical spine
were also generated.

[Series 4: c_spine 2.0 st · axial · 0.29mm/px · z∈[-241,-123]mm · 4 of 89 slices shown, 5 images]
[im 15/89  soft-tissue]
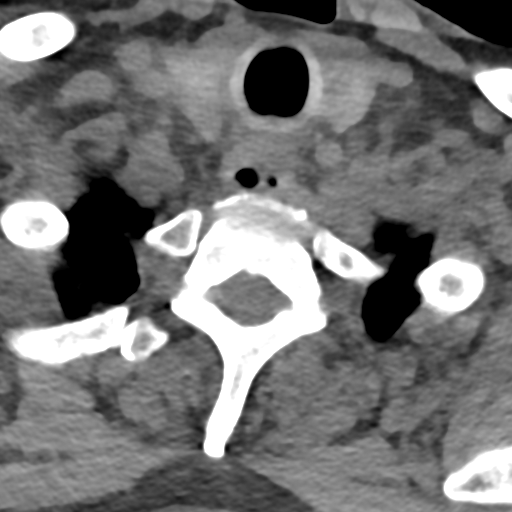
[im 15/89  bone]
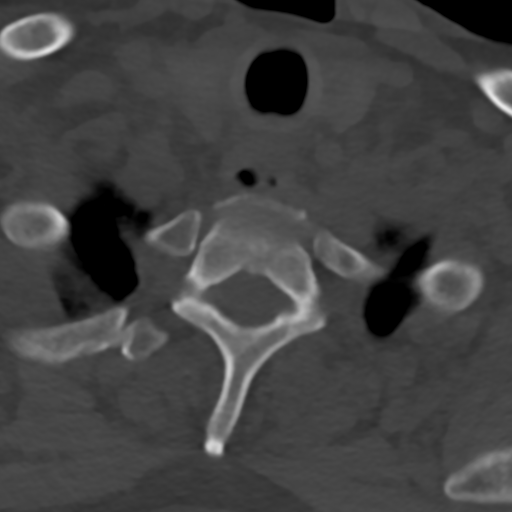
[im 30/89  bone]
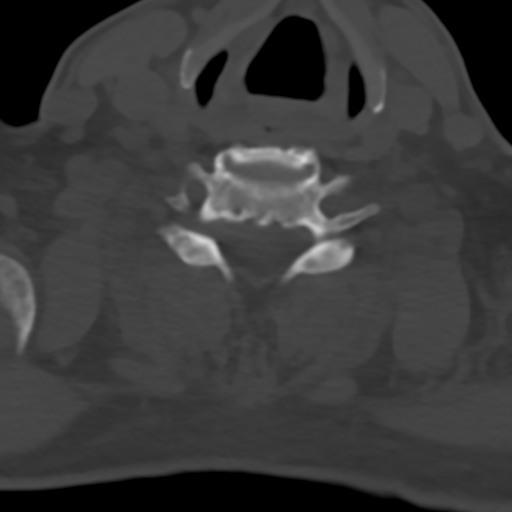
[im 59/89  bone]
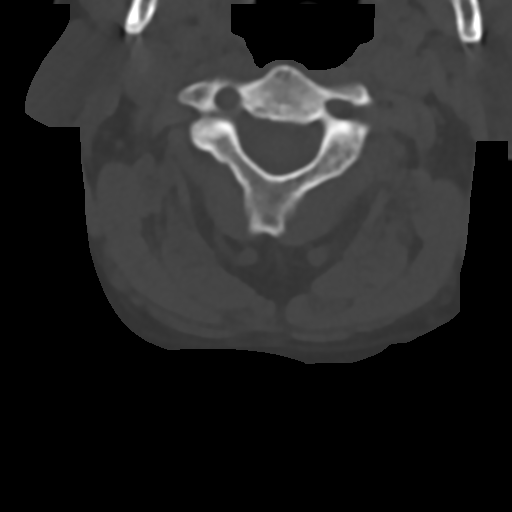
[im 74/89  bone]
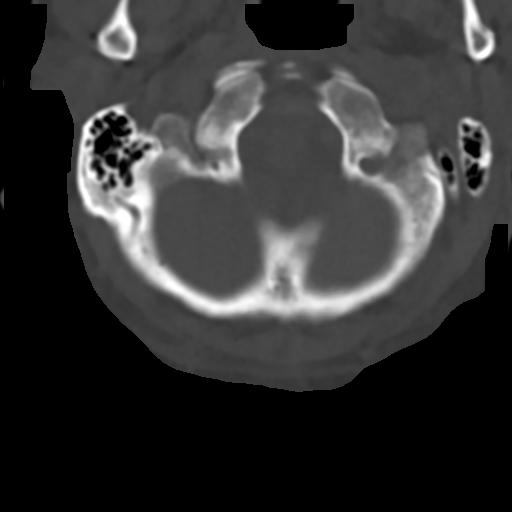

[Series 9: c_spine 2.0 sag bone · sagittal · 0.26mm/px · 5 of 61 slices shown, 6 images]
[im 21/61  bone]
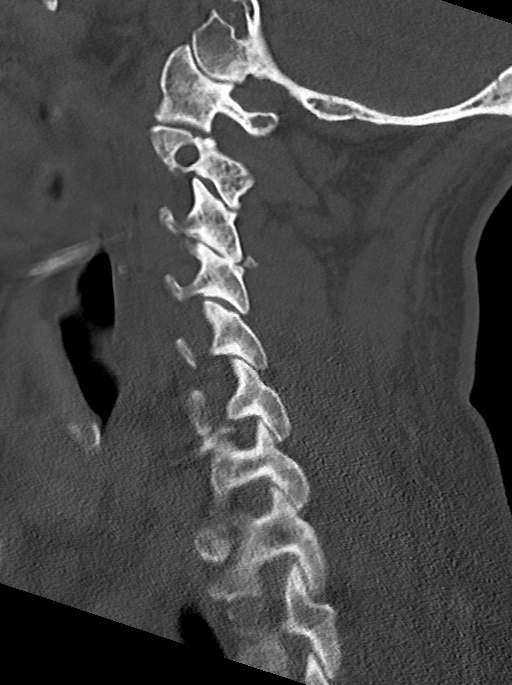
[im 26/61  bone]
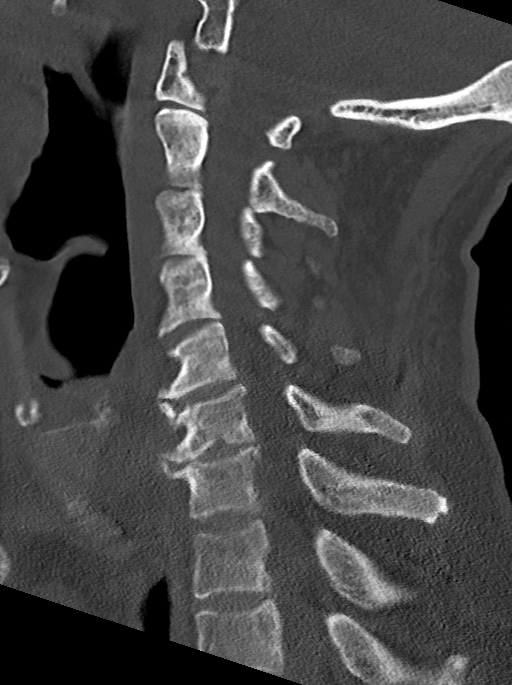
[im 31/61  soft-tissue]
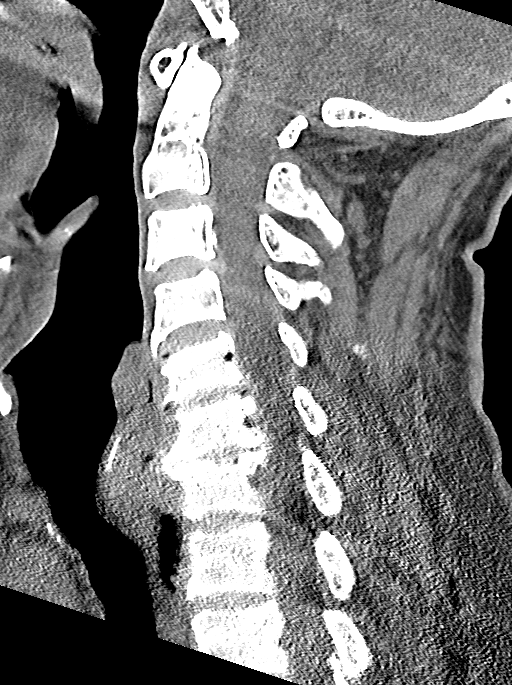
[im 31/61  bone]
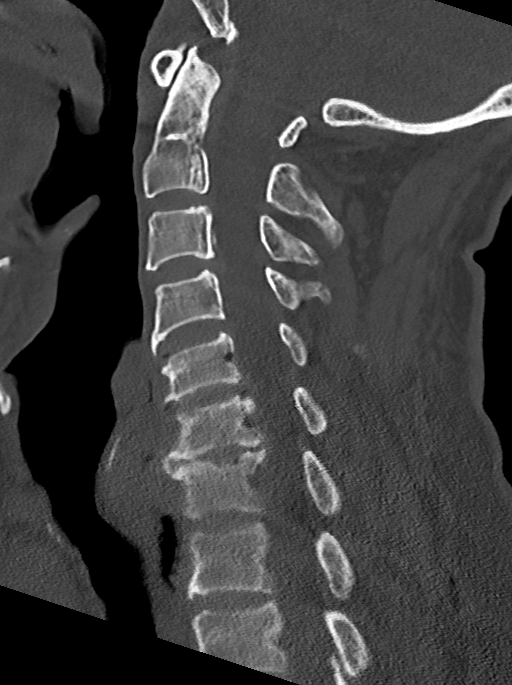
[im 36/61  bone]
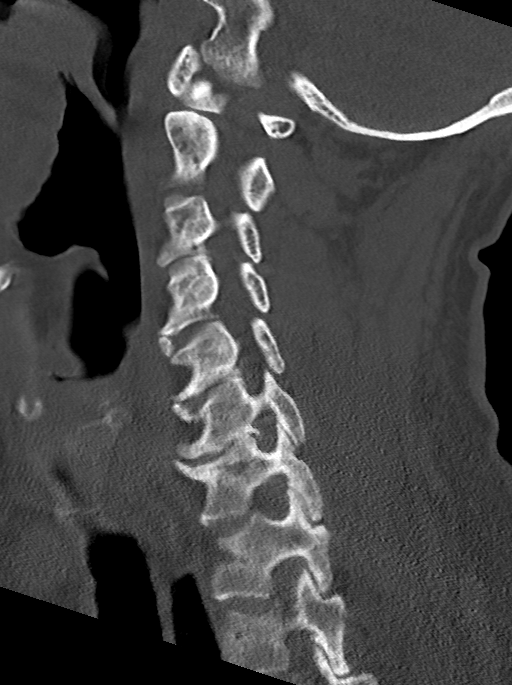
[im 41/61  bone]
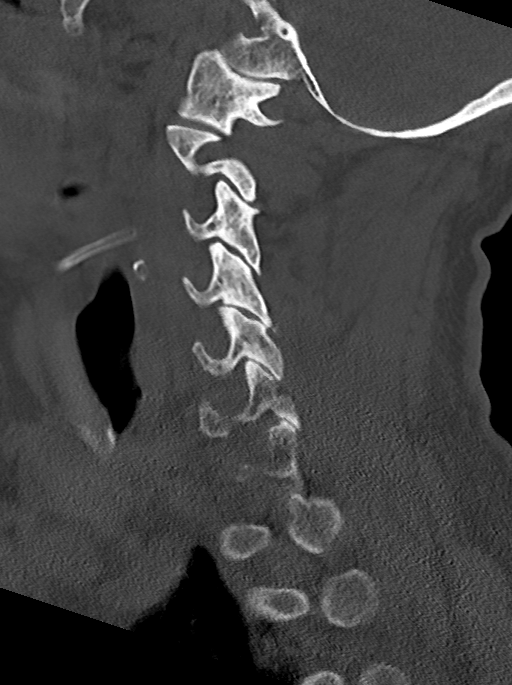

[Series 10: c_spine 2.0 cor bone · coronal · 0.23mm/px · 3 of 67 slices shown]
[im 14/67  bone]
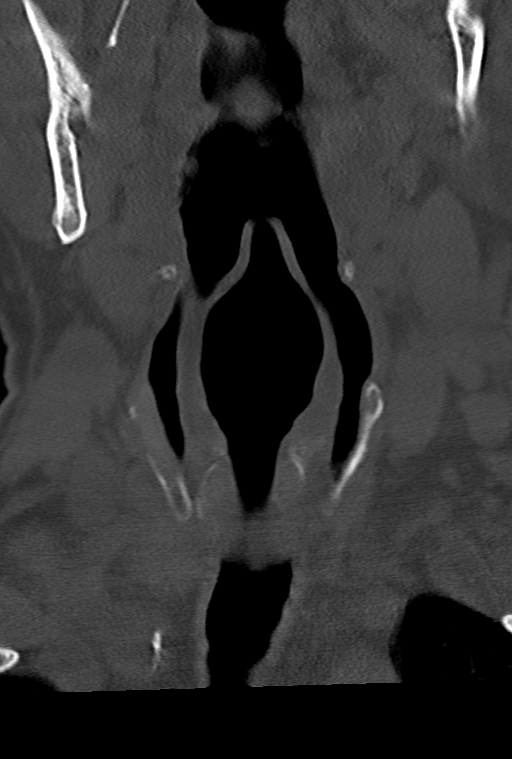
[im 27/67  bone]
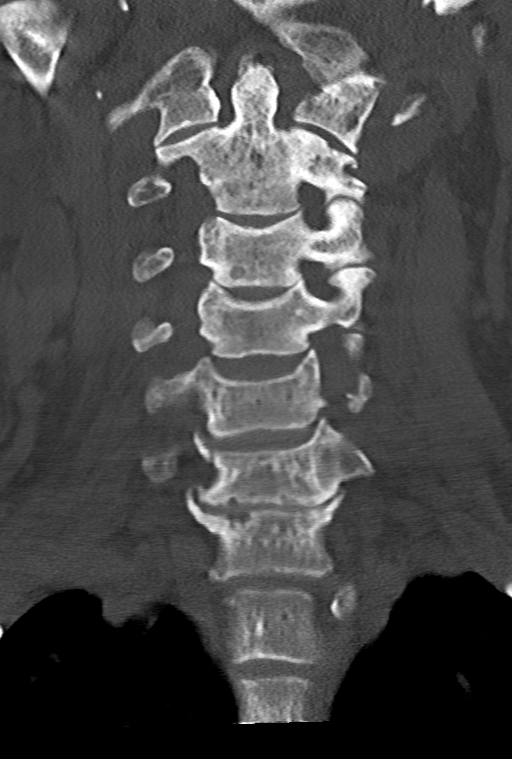
[im 40/67  bone]
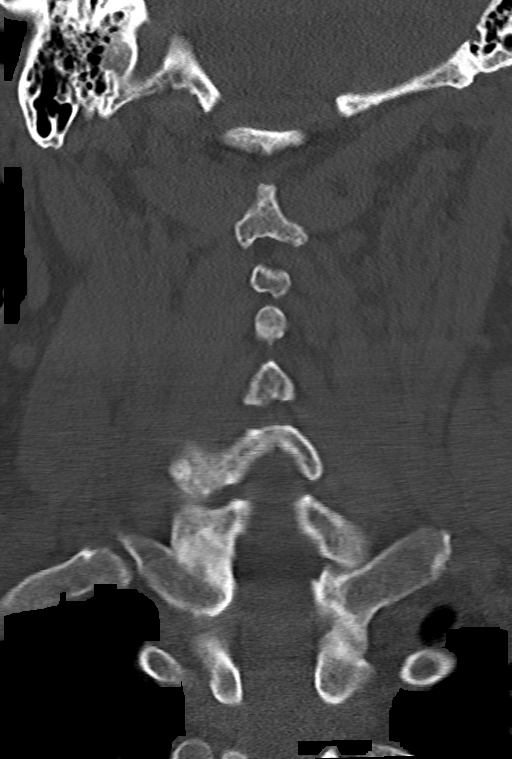

[12 of 33 positions shown; findings below may reference images not displayed]

FINDINGS: CT HEAD FINDINGS

Brain: Stable age related involutional changes of the brain. Chronic
small vessel ischemic disease of periventricular subcortical white
matter. No evidence of acute large vascular territory infarct,
hemorrhage, hydrocephalus, extra-axial fluid collection mass or mass
effect. Midline fourth ventricle basal cisterns without effacement.
Brainstem and cerebellum are nonacute.

Vascular: Moderate atherosclerosis of the carotid siphons
bilaterally. No hyperdense vessel sign.

Skull: Intact

Sinuses/Orbits: Minimal anterior ethmoid sinus mucosal thickening
right. Intact orbits and globes.

Other: Clear bilateral mastoids.

CT CERVICAL SPINE FINDINGS

Alignment: Slight reversal cervical lordosis with apex at C5-6
attributable to degenerative disc disease.

Skull base and vertebrae: Intact

Soft tissues and spinal canal: No prevertebral fluid or swelling. No
visible canal hematoma.

Disc levels: Slight disc space narrowing at C5-6 and moderate at
C6-7. Small anterior posterior osteophytes are noted at C6-7 with
uncovertebral joint osteoarthritis and minimal foraminal
encroachment bilaterally due to uncinate spurring at this level.
Facet joints are maintained.

Upper chest: Negative.

Other: None
IMPRESSION: 1. Chronic small vessel ischemic disease of periventricular
subcortical white matter. No acute intracranial abnormality.
2. No acute posttraumatic cervical spine fracture or subluxation.
